# Patient Record
Sex: Female | Born: 1965 | Race: Black or African American | Hispanic: No | Smoking: Never smoker
Health system: Southern US, Community
[De-identification: ages and names within clinical notes are randomized; demographics above are authoritative.]

## PROBLEM LIST (undated history)

## (undated) DIAGNOSIS — I10 Essential (primary) hypertension: Secondary | ICD-10-CM

## (undated) DIAGNOSIS — Z87442 Personal history of urinary calculi: Secondary | ICD-10-CM

## (undated) HISTORY — PX: THYROIDECTOMY: SHX17

## (undated) HISTORY — PX: CHOLECYSTECTOMY: SHX55

## (undated) HISTORY — PX: TUBAL LIGATION: SHX77

---

## 2004-08-29 ENCOUNTER — Emergency Department: Payer: Self-pay | Admitting: Internal Medicine

## 2004-12-08 ENCOUNTER — Emergency Department: Payer: Self-pay | Admitting: Emergency Medicine

## 2005-12-14 ENCOUNTER — Emergency Department: Payer: Self-pay | Admitting: Internal Medicine

## 2006-02-27 ENCOUNTER — Emergency Department: Payer: Self-pay | Admitting: Emergency Medicine

## 2006-07-17 ENCOUNTER — Other Ambulatory Visit: Payer: Self-pay

## 2006-07-17 ENCOUNTER — Emergency Department: Payer: Self-pay | Admitting: General Practice

## 2006-10-27 ENCOUNTER — Emergency Department: Payer: Self-pay | Admitting: Emergency Medicine

## 2006-12-16 ENCOUNTER — Emergency Department: Payer: Self-pay | Admitting: Emergency Medicine

## 2007-01-15 ENCOUNTER — Emergency Department: Payer: Self-pay | Admitting: Emergency Medicine

## 2007-04-12 ENCOUNTER — Ambulatory Visit: Payer: Self-pay | Admitting: Family Medicine

## 2007-06-13 ENCOUNTER — Emergency Department: Payer: Self-pay | Admitting: Internal Medicine

## 2007-10-13 ENCOUNTER — Emergency Department: Payer: Self-pay | Admitting: Emergency Medicine

## 2008-02-13 ENCOUNTER — Emergency Department: Payer: Self-pay | Admitting: Internal Medicine

## 2008-09-24 ENCOUNTER — Emergency Department: Payer: Self-pay | Admitting: Emergency Medicine

## 2009-01-29 ENCOUNTER — Emergency Department: Payer: Self-pay | Admitting: Emergency Medicine

## 2009-03-04 ENCOUNTER — Emergency Department: Payer: Self-pay | Admitting: Unknown Physician Specialty

## 2010-05-28 ENCOUNTER — Emergency Department: Payer: Self-pay | Admitting: Emergency Medicine

## 2010-06-07 ENCOUNTER — Emergency Department: Payer: Self-pay | Admitting: Emergency Medicine

## 2010-08-13 ENCOUNTER — Emergency Department: Payer: Self-pay | Admitting: Emergency Medicine

## 2010-08-18 ENCOUNTER — Emergency Department: Payer: Self-pay | Admitting: Emergency Medicine

## 2010-11-22 ENCOUNTER — Emergency Department: Payer: Self-pay | Admitting: Emergency Medicine

## 2011-03-14 ENCOUNTER — Emergency Department: Payer: Self-pay | Admitting: Emergency Medicine

## 2011-07-14 ENCOUNTER — Emergency Department: Payer: Self-pay | Admitting: Emergency Medicine

## 2012-05-24 ENCOUNTER — Emergency Department: Payer: Self-pay | Admitting: Emergency Medicine

## 2013-08-24 ENCOUNTER — Ambulatory Visit: Payer: Self-pay | Admitting: Otolaryngology

## 2013-09-11 ENCOUNTER — Ambulatory Visit: Payer: Self-pay | Admitting: Otolaryngology

## 2013-09-11 DIAGNOSIS — I10 Essential (primary) hypertension: Secondary | ICD-10-CM

## 2013-09-11 LAB — POTASSIUM: POTASSIUM: 3.3 mmol/L — AB (ref 3.5–5.1)

## 2013-09-20 ENCOUNTER — Ambulatory Visit: Payer: Self-pay | Admitting: Otolaryngology

## 2013-09-20 LAB — CALCIUM: CALCIUM: 9.1 mg/dL (ref 8.5–10.1)

## 2013-09-20 LAB — POTASSIUM: Potassium: 3.7 mmol/L (ref 3.5–5.1)

## 2013-09-21 LAB — CALCIUM: Calcium, Total: 8.6 mg/dL (ref 8.5–10.1)

## 2013-09-22 LAB — CALCIUM: CALCIUM: 9.2 mg/dL (ref 8.5–10.1)

## 2013-09-22 LAB — PATHOLOGY REPORT

## 2014-01-06 ENCOUNTER — Emergency Department: Payer: Self-pay | Admitting: Internal Medicine

## 2014-01-06 LAB — COMPREHENSIVE METABOLIC PANEL
AST: 11 U/L — AB (ref 15–37)
Albumin: 3.7 g/dL (ref 3.4–5.0)
Alkaline Phosphatase: 74 U/L
Anion Gap: 5 — ABNORMAL LOW (ref 7–16)
BILIRUBIN TOTAL: 0.5 mg/dL (ref 0.2–1.0)
BUN: 13 mg/dL (ref 7–18)
CREATININE: 0.9 mg/dL (ref 0.60–1.30)
Calcium, Total: 9 mg/dL (ref 8.5–10.1)
Chloride: 101 mmol/L (ref 98–107)
Co2: 32 mmol/L (ref 21–32)
EGFR (African American): >60
EGFR (Non-African Amer.): >60
Glucose: 101 mg/dL — ABNORMAL HIGH (ref 65–99)
OSMOLALITY: 276 (ref 275–301)
POTASSIUM: 3.4 mmol/L — AB (ref 3.5–5.1)
SGPT (ALT): 20 U/L (ref 12–78)
Sodium: 138 mmol/L (ref 136–145)
TOTAL PROTEIN: 8.1 g/dL (ref 6.4–8.2)

## 2014-01-06 LAB — CBC
HCT: 36.1 % (ref 35.0–47.0)
HGB: 12 g/dL (ref 12.0–16.0)
MCH: 27.1 pg (ref 26.0–34.0)
MCHC: 33.3 g/dL (ref 32.0–36.0)
MCV: 81 fL (ref 80–100)
Platelet: 225 10*3/uL (ref 150–440)
RBC: 4.44 10*6/uL (ref 3.80–5.20)
RDW: 14.2 % (ref 11.5–14.5)
WBC: 5.9 10*3/uL (ref 3.6–11.0)

## 2014-01-06 LAB — T4, FREE: Free Thyroxine: 1.13 ng/dL (ref 0.76–1.46)

## 2014-01-06 LAB — TSH: Thyroid Stimulating Horm: 28.3 u[IU]/mL — ABNORMAL HIGH

## 2014-01-06 LAB — PRO B NATRIURETIC PEPTIDE: B-Type Natriuretic Peptide: 26 pg/mL (ref 0–125)

## 2014-01-06 LAB — TROPONIN I: Troponin-I: <0.02 ng/mL

## 2014-01-19 ENCOUNTER — Emergency Department: Payer: Self-pay | Admitting: Emergency Medicine

## 2014-01-20 LAB — URINALYSIS, COMPLETE
Bacteria: NONE SEEN
Bilirubin,UR: NEGATIVE
Glucose,UR: NEGATIVE mg/dL (ref 0–75)
Ketone: NEGATIVE
Leukocyte Esterase: NEGATIVE
Nitrite: NEGATIVE
PROTEIN: NEGATIVE
Ph: 6 (ref 4.5–8.0)
RBC,UR: 131 /HPF (ref 0–5)
SPECIFIC GRAVITY: 1.019 (ref 1.003–1.030)
Squamous Epithelial: <1
WBC UR: 1 /HPF (ref 0–5)

## 2014-06-09 ENCOUNTER — Emergency Department: Payer: Self-pay | Admitting: Emergency Medicine

## 2014-06-11 ENCOUNTER — Emergency Department: Payer: Self-pay | Admitting: Emergency Medicine

## 2014-11-03 NOTE — Discharge Summary (Signed)
PATIENT NAME:  Kayla Christensen, Kayla Christensen MR#:  [id number] DATE OF BIRTH:  August 20, 1965  DATE OF ADMISSION:  09/20/2013 DATE OF DISCHARGE:  09/22/2013  REASON FOR ADMISSION: Thyroid goiter.  OPERATIONS/MAJOR PROCEDURES: Total thyroidectomy performed on 09/20/2013.   HISTORY OF PRESENT ILLNESS: Christensen 49 year old female presented with Christensen massive thyroid goiter causing compression of the throat. She was taken to the operating room on 09/20/2013 and total thyroidectomy was performed. Postoperatively, she was observed on the fluoro monitoring for stability of her calcium level. Drains were also in place. Calcium level did not drop in the postoperative period. Postoperative day #1, drains were removed. Calcium level remained stable, but she is still having Christensen bit of discomfort and slow to return to normal diet. On  postoperative day 2, the patient was eating Christensen normal diet with reasonable pain control. She was discharged home on Lortab for pain control and was to resume her calcium supplement. She is to follow up in Christensen week for suture removal. The calcium level on postoperative day #2 remained normal.   ____________________________ Ollen GrossPaul S. Willeen CassBennett, MD psb:aw D: 10/10/2013 11:58:46 ET T: 10/10/2013 12:13:09 ET JOB#: [id number]  cc: Ollen GrossPaul S. Willeen CassBennett, MD, <Dictator> Sandi MealyPAUL S Cenia Zaragosa MD ELECTRONICALLY SIGNED 10/17/2013 10:58

## 2014-11-03 NOTE — Op Note (Signed)
PATIENT NAME:  Kayla Christensen, Kayla Christensen MR#:  [id number] DATE OF BIRTH:  1966/03/28  DATE OF PROCEDURE:  09/20/2013  PREOPERATIVE DIAGNOSIS: Thyroid goiter.   POSTOPERATIVE DIAGNOSIS: Thyroid goiter.   PROCEDURE: Total thyroidectomy.   SURGEON: Marion Downer, MD  FIRST ASSISTANT: Cammy Copa, MD  ANESTHESIA: General endotracheal.   INDICATIONS: A 49 year old with a massively enlarged thyroid goiter which is causing some restriction of the airway for the patient in certain positions.   FINDINGS: This was a massive thyroid goiter with each lobe measuring over 9 cm in greatest dimension, with multiple nodules in both lobes.   COMPLICATIONS: None.   DESCRIPTION OF PROCEDURE: After obtaining informed consent, the patient was taken to the operating room and placed in the supine position. General endotracheal anesthesia was administered and the tube placed under direct visualization so that the laryngeal monitor electrode on the tube was properly positioned between the vocal cords. The skin was then injected in the lower neck with 1% lidocaine with epinephrine 1:100,000. She was then prepped and draped in the usual sterile fashion. A 15-blade was used to incise the skin and the incision carried down through the platysma with the Bovie. The strap muscles were identified and divided in the midline and retracted laterally. The right thyroid lobe was addressed first. Blunt dissection was performed on the capsule of the gland, dissecting fascial attachments away from the thyroid lobe. It was very difficult to get into the lateral compartment of the neck to the lateral aspect of the gland, so the strap muscle on this side was divided with the Bovie. This allowed better visualization for dissection around the gland. The superior pole vessels were then carefully dissected out. As vessels were encountered, they were divided using the Harmonic scalpel. The dissection proceeded around the lateral aspect of the  lobe, dividing small vascular attachments, and down to the inferior lobe, where the inferior pole vessels were identified and divided at the capsule of the gland. Inferiorly, a parathyroid gland was identified and carefully preserved along with its vascular pedicle. Dissection was difficult due to the size of the gland, but eventually, with a combination of blunt dissection and dividing soft tissue attachments with the Harmonic scalpel, the gland was freed up enough to be rotated out of the lateral neck. This provided visualization along the tracheoesophageal groove. Careful dissection in this area revealed the recurrent laryngeal nerve, which was dissected up to its entrance into the larynx. With this path well-visualized, the gland was then carefully dissected off of the trachea using the Harmonic scalpel. The gland was divided at the isthmus and delivered and sent as a specimen. Next, the left lobe of the thyroid gland was dissected in the same fashion, once again using blunt dissection around the capsule of the gland to free it up from the overlying strap muscles. On this side, we were able to dissect the gland, with the neck having been somewhat decompressed after removal of the right lobe, without dividing the strap muscles on the left-hand side. The gland was carefully dissected out, and both superior and inferior pole vessels divided as they were encountered using the Harmonic scalpel. Parathyroid glands were identified, both superiorly and inferiorly on the left side. The gland was rotated medially and dissected away from the trachea after identifying the recurrent laryngeal nerve and protecting it during the dissection. The gland was then completely dissected off the trachea and delivered and sent as a separate specimen. Both recurrent laryngeal nerves were noted to  stimulate appropriately at the end of the case. Three of the four parathyroid glands were identified, and there was no evidence of any  parathyroid tissue on the specimen itself. The wound was irrigated and bleeding controlled with the bipolar cautery. The #10 TLS drains were placed in both sides of the neck and secured with 3-0 Prolene suture. The strap muscle on the right side was reapproximated with 4-0 Vicryl suture. The platysma muscle was then reapproximated with 4-0 Vicryl, followed by subcutaneous closure with 4-0 Vicryl suture. The skin was closed with a 3-0 Prolene suture in a running subcuticular stitch. The drains were then hooked up to suction. Bacitracin ointment was applied, and the patient returned to the anesthesiologist for awakening. She was awakened and taken to the recovery room in good condition postoperatively. Blood loss was approximately 50 mL.   ____________________________ Spruha Weight S. Willeen CassBennett, MD psb:lb D: 03/11/2015Ollen Gross 13:35:03 ET T: 09/20/2013 13:50:11 ET JOB#: [id number]  cc: Ollen GrossPaul S. Willeen CassBennett, MD, <Dictator> Sandi MealyPAUL S Austynn Pridmore MD ELECTRONICALLY SIGNED 09/20/2013 16:11

## 2015-04-21 ENCOUNTER — Emergency Department: Payer: Commercial Managed Care - HMO

## 2015-04-21 ENCOUNTER — Emergency Department
Admission: EM | Admit: 2015-04-21 | Discharge: 2015-04-21 | Disposition: A | Discharge status: Home / Self Care | Payer: Commercial Managed Care - HMO | Attending: Emergency Medicine | Admitting: Emergency Medicine

## 2015-04-21 ENCOUNTER — Encounter: Payer: Self-pay | Admitting: Emergency Medicine

## 2015-04-21 DIAGNOSIS — I517 Cardiomegaly: Secondary | ICD-10-CM

## 2015-04-21 DIAGNOSIS — Z880 Allergy status to penicillin: Secondary | ICD-10-CM | POA: Diagnosis not present

## 2015-04-21 DIAGNOSIS — I10 Essential (primary) hypertension: Secondary | ICD-10-CM

## 2015-04-21 DIAGNOSIS — R109 Unspecified abdominal pain: Secondary | ICD-10-CM | POA: Insufficient documentation

## 2015-04-21 DIAGNOSIS — R079 Chest pain, unspecified: Secondary | ICD-10-CM

## 2015-04-21 DIAGNOSIS — R0789 Other chest pain: Secondary | ICD-10-CM | POA: Diagnosis present

## 2015-04-21 HISTORY — DX: Essential (primary) hypertension: I10

## 2015-04-21 LAB — CBC WITH DIFFERENTIAL/PLATELET
Basophils Absolute: 0.1 10*3/uL (ref 0–0.1)
Basophils Relative: 1 %
EOS ABS: 0.1 10*3/uL (ref 0–0.7)
EOS PCT: 2 %
HCT: 35.8 % (ref 35.0–47.0)
Hemoglobin: 11.6 g/dL — ABNORMAL LOW (ref 12.0–16.0)
LYMPHS ABS: 2.2 10*3/uL (ref 1.0–3.6)
Lymphocytes Relative: 39 %
MCH: 26.2 pg (ref 26.0–34.0)
MCHC: 32.5 g/dL (ref 32.0–36.0)
MCV: 80.8 fL (ref 80.0–100.0)
MONOS PCT: 9 %
Monocytes Absolute: 0.5 10*3/uL (ref 0.2–0.9)
Neutro Abs: 2.8 10*3/uL (ref 1.4–6.5)
Neutrophils Relative %: 49 %
PLATELETS: 214 10*3/uL (ref 150–440)
RBC: 4.44 MIL/uL (ref 3.80–5.20)
RDW: 14.7 % — ABNORMAL HIGH (ref 11.5–14.5)
WBC: 5.7 10*3/uL (ref 3.6–11.0)

## 2015-04-21 LAB — URINALYSIS COMPLETE WITH MICROSCOPIC (ARMC ONLY)
Bilirubin Urine: NEGATIVE
Glucose, UA: NEGATIVE mg/dL
KETONES UR: NEGATIVE mg/dL
Leukocytes, UA: NEGATIVE
NITRITE: NEGATIVE
PH: 6 (ref 5.0–8.0)
PROTEIN: 30 mg/dL — AB
SPECIFIC GRAVITY, URINE: 1.01 (ref 1.005–1.030)

## 2015-04-21 LAB — COMPREHENSIVE METABOLIC PANEL
ALT: 16 U/L (ref 14–54)
ANION GAP: 5 (ref 5–15)
AST: 24 U/L (ref 15–41)
Albumin: 4.2 g/dL (ref 3.5–5.0)
Alkaline Phosphatase: 54 U/L (ref 38–126)
BUN: 14 mg/dL (ref 6–20)
CHLORIDE: 107 mmol/L (ref 101–111)
CO2: 28 mmol/L (ref 22–32)
Calcium: 8.8 mg/dL — ABNORMAL LOW (ref 8.9–10.3)
Creatinine, Ser: 0.86 mg/dL (ref 0.44–1.00)
GFR calc non Af Amer: >60 mL/min (ref >60)
Glucose, Bld: 117 mg/dL — ABNORMAL HIGH (ref 65–99)
POTASSIUM: 2.9 mmol/L — AB (ref 3.5–5.1)
SODIUM: 140 mmol/L (ref 135–145)
Total Bilirubin: 0.7 mg/dL (ref 0.3–1.2)
Total Protein: 7.7 g/dL (ref 6.5–8.1)

## 2015-04-21 LAB — TROPONIN I: Troponin I: <0.03 ng/mL (ref <0.031)

## 2015-04-21 MED ORDER — KETOROLAC TROMETHAMINE 10 MG PO TABS: 10.0000 mg | ORAL_TABLET | Freq: Three times a day (TID) | ORAL | Status: DC | PRN

## 2015-04-21 MED ORDER — POTASSIUM CHLORIDE CRYS ER 20 MEQ PO TBCR
40.0000 meq | EXTENDED_RELEASE_TABLET | Freq: Once | ORAL | Status: AC
  Administered 2015-04-21: 40 meq via ORAL
  Filled 2015-04-21: qty 2

## 2015-04-21 MED ORDER — KETOROLAC TROMETHAMINE 30 MG/ML IJ SOLN
30.0000 mg | Freq: Once | INTRAMUSCULAR | Status: AC
  Administered 2015-04-21: 30 mg via INTRAVENOUS
  Filled 2015-04-21: qty 1

## 2015-04-21 NOTE — ED Notes (Signed)
Pt states left sided flank pain that "ends at that last rib". Pt denies nausea, vomiting, dizziness, shortness of breath. Pt pointing to lateral left chest wall.

## 2015-04-21 NOTE — ED Provider Notes (Signed)
[facility] Emergency Department Provider Note  ____________________________________________  Time seen: Approximately 9:11 PM  I have reviewed the triage vital signs and the nursing notes.   HISTORY  Chief Complaint Flank Pain    HPI Kayla Christensen is a 49 y.o. female with a history of HTN, status post thyroidectomy, presenting with left lateral chest wall pain. Patient reports that several days ago she developed an "aggravating," left lateral chest wall pain. It is worse with positional changes. It is not associated with shortness of breath, palpitations, radiation, nausea or vomiting, fever, cough or reported skin changes. Pain is not pleuritic. No recent known strain or new exercises.. She has tried a heating pad which has helped. She has not tried any medications for her pain.   Past Medical History  Diagnosis Date  . Hypertension     There are no active problems to display for this patient.   Past Surgical History  Procedure Laterality Date  . Thyroidectomy      Current Outpatient Rx  Name  Route  Sig  Dispense  Refill  . ketorolac (TORADOL) 10 MG tablet   Oral   Take 1 tablet (10 mg total) by mouth every 8 (eight) hours as needed for moderate pain (with food).   15 tablet   0     Allergies Penicillins  History reviewed. No pertinent family history.  Social History Social History  Substance Use Topics  . Smoking status: Never Smoker   . Smokeless tobacco: Never Used  . Alcohol Use: No    Review of Systems Constitutional: No fever/chills. No syncope. No lightheadedness. Eyes: No visual changes. ENT: No sore throat. Cardiovascular: Positive chest pain, no palpitations. Respiratory: Denies shortness of breath.  No cough. Gastrointestinal: No abdominal pain.  No nausea, no vomiting.  No diarrhea.  No constipation. Genitourinary: Negative for dysuria. No hematuria. No change in frequency. Musculoskeletal: Negative for back  pain. Skin: Negative for rash. Neurological: Negative for headaches, focal weakness or numbness.  10-point ROS otherwise negative.  ____________________________________________   PHYSICAL EXAM:  VITAL SIGNS: ED Triage Vitals  Enc Vitals Group     BP 04/21/15 2044 193/104 mmHg     Pulse Rate 04/21/15 2044 65     Resp 04/21/15 2044 16     Temp 04/21/15 2044 98.4 F (36.9 C)     Temp Source 04/21/15 2044 Oral     SpO2 04/21/15 2044 99 %     Weight 04/21/15 2044 260 lb (117.935 kg)     Height 04/21/15 2044  (1.753 m)     Head Cir --      Peak Flow --      Pain Score 04/21/15 2045 7     Pain Loc --      Pain Edu? --      Excl. in GC? --     Constitutional: Alert and oriented. Well appearing and in no acute distress. Answer question appropriately. Eyes: Conjunctivae are normal.  EOMI. Head: Atraumatic. Nose: No congestion/rhinnorhea. Mouth/Throat: Mucous membranes are moist.  Neck: No stridor.  Supple.  Cardiovascular: Normal rate, regular rhythm. No murmurs, rubs or gallops.  Respiratory: Normal respiratory effort.  No retractions. Lungs CTAB.  No wheezes, rales or ronchi. No reproducible tenderness to the palpation on the left lateral chest wall. The skin is normal. There is no crepitus. There is no underlying instability of the ribs. Gastrointestinal: Soft and nontender. No distention. No peritoneal signs. Musculoskeletal: No LE edema. No  palpable cords or calf pain. No Homans sign. Neurologic:  Normal speech and language. No gross focal neurologic deficits are appreciated.  Skin:  Skin is warm, dry and intact. No rash noted. Psychiatric: Mood and affect are normal. Speech and behavior are normal.  Normal judgement.  ____________________________________________   LABS (all labs ordered are listed, but only abnormal results are displayed)  Labs Reviewed  CBC WITH DIFFERENTIAL/PLATELET - Abnormal; Notable for the following:    Hemoglobin 11.6 (*)    RDW 14.7 (*)     All other components within normal limits  COMPREHENSIVE METABOLIC PANEL - Abnormal; Notable for the following:    Potassium 2.9 (*)    Glucose, Bld 117 (*)    Calcium 8.8 (*)    All other components within normal limits  URINALYSIS COMPLETEWITH MICROSCOPIC ([facility] ONLY) - Abnormal; Notable for the following:    Color, Urine STRAW (*)    APPearance CLEAR (*)    Hgb urine dipstick 1+ (*)    Protein, ur 30 (*)    Bacteria, UA RARE (*)    Squamous Epithelial / LPF 0-5 (*)    All other components within normal limits  TROPONIN I   ____________________________________________  EKG  ED ECG REPORT I, Rockne Menghini, the attending physician, personally viewed and interpreted this ECG.   Date: 04/21/2015  EKG Time: 2051  Rate: 70  Rhythm: normal EKG, normal sinus rhythm, unchanged from previous tracings, normal sinus rhythm  Axis: Leftward  Intervals:none  ST&T Change: Nonspecific less than 0.5 mm elevation in V1 with reciprocal changes.  ____________________________________________  RADIOLOGY  Dg Chest 2 View  04/21/2015   CLINICAL DATA:  Initial evaluation for acute left-sided chest pain for 4 days.  EXAM: CHEST  2 VIEW  COMPARISON:  Prior radiograph from 01/06/2014.  FINDINGS: Mild cardiomegaly is stable. Mediastinal silhouette within normal limits. Mild tortuosity the intrathoracic aorta.  The lungs are normally inflated. No airspace consolidation, pleural effusion, or pulmonary edema is identified. There is no pneumothorax.  No acute osseous abnormality identified.  IMPRESSION: 1. No active cardiopulmonary disease. 2. Mild cardiomegaly, stable.   Electronically Signed   By: Rise Mu M.D.   On: 04/21/2015 21:49    ____________________________________________   PROCEDURES  Procedure(s) performed: None  Critical Care performed: No ____________________________________________   INITIAL IMPRESSION / ASSESSMENT AND PLAN / ED COURSE  Pertinent labs &  imaging results that were available during my care of the patient were reviewed by me and considered in my medical decision making (see chart for details).  49 y.o. female with a history of hypertension presenting for left lateral chest wall pain which is positional. Will do cardiac evaluation and given the length of symptoms, will do a single set of troponin. We'll get a chest x-ray to rule out pneumothorax, she has good breath sounds. Will treat symptoms with Toradol and reevaluate.  ----------------------------------------- 10:06 PM on 04/21/2015 -----------------------------------------  Pt is feeling better after Toradol.  Patient EKG does not show any ischemic changes. Her troponin is negative. Her symptoms are atypical and this would be very unlikely to represent an MI. Her chest x-ray shows stable cardiomegaly. I will relay this to the patient and she will need to have it followed up by her primary care physician; probably discuss echocardiogram as an outpatient. The rest of her labs are reassuring except for hypokalemia which I will supplement and she will also follow up with a primary care physician for this. Plan discharge.  ____________________________________________  FINAL  CLINICAL IMPRESSION(S) / ED DIAGNOSES  Final diagnoses:  Left sided chest pain  Essential hypertension  Enlarged heart      NEW MEDICATIONS STARTED DURING THIS VISIT:  New Prescriptions   KETOROLAC (TORADOL) 10 MG TABLET    Take 1 tablet (10 mg total) by mouth every 8 (eight) hours as needed for moderate pain (with food).     Rockne Menghini, MD 04/21/15 2218

## 2015-04-21 NOTE — Discharge Instructions (Signed)
Chest Pain Observation It is often hard to give a specific diagnosis for the cause of chest pain. Among other possibilities your symptoms might be caused by inadequate oxygen delivery to your heart (angina). Angina that is not treated or evaluated can lead to a heart attack (myocardial infarction) or death. Blood tests, electrocardiograms, and X-rays may have been done to help determine a possible cause of your chest pain. After evaluation and observation, your health care provider has determined that it is unlikely your pain was caused by an unstable condition that requires hospitalization. However, a full evaluation of your pain may need to be completed, with additional diagnostic testing as directed. It is very important to keep your follow-up appointments. Not keeping your follow-up appointments could result in permanent heart damage, disability, or death. If there is any problem keeping your follow-up appointments, you must call your health care provider. HOME CARE INSTRUCTIONS  Due to the slight chance that your pain could be angina, it is important to follow your health care provider's treatment plan and also maintain a healthy lifestyle:  Maintain or work toward achieving a healthy weight.  Stay physically active and exercise regularly.  Decrease your salt intake.  Eat a balanced, healthy diet. Talk to a dietitian to learn about heart-healthy foods.  Increase your fiber intake by including whole grains, vegetables, fruits, and nuts in your diet.  Avoid situations that cause stress, anger, or depression.  Take medicines as advised by your health care provider. Report any side effects to your health care provider. Do not stop medicines or adjust the dosages on your own.  Quit smoking. Do not use nicotine patches or gum until you check with your health care provider.  Keep your blood pressure, blood sugar, and cholesterol levels within normal limits.  Limit alcohol intake to no more than  1 drink per day for women who are not pregnant and 2 drinks per day for men.  Do not abuse drugs. SEEK IMMEDIATE MEDICAL CARE IF: You have severe chest pain or pressure which may include symptoms such as:  You feel pain or pressure in your arms, neck, jaw, or back.  You have severe back or abdominal pain, feel sick to your stomach (nauseous), or throw up (vomit).  You are sweating profusely.  You are having a fast or irregular heartbeat.  You feel short of breath while at rest.  You notice increasing shortness of breath during rest, sleep, or with activity.  You have chest pain that does not get better after rest or after taking your usual medicine.  You wake from sleep with chest pain.  You are unable to sleep because you cannot breathe.  You develop a frequent cough or you are coughing up blood.  You feel dizzy, faint, or experience extreme fatigue.  You develop severe weakness, dizziness, fainting, or chills. Any of these symptoms may represent a serious problem that is an emergency. Do not wait to see if the symptoms will go away. Call your local emergency services (911 in the [place] Do not drive yourself to the hospital. MAKE SURE YOU:  Understand these instructions.  Will watch your condition.  Will get help right away if you are not doing well or get worse.    Please make an appointment with your primary care physician to discuss your chest pain and to have your blood pressure reevaluated. Continue to take your blood pressure medication as prescribed. You may take Toradol for the chest wall pain. You may also  apply a heat pack or an ice pack to the side of your chest for 20 minutes every 2-3 hours for pain control.  Please also talked to your physician about your enlarged heart. Your physician will order an outpatient echocardiogram.  Please return to the emergency department if he develops severe pain, passing out, shortness of breath, nausea vomiting or sweatiness,  or any other symptoms concerning to you.   This information is not intended to replace advice given to you by your health care provider. Make sure you discuss any questions you have with your health care provider.   Document Released: 08/01/2010 Document Revised: 07/04/2013 Document Reviewed: 12/29/2012 Elsevier Interactive Patient Education [employer]

## 2015-07-09 ENCOUNTER — Emergency Department: Payer: Commercial Managed Care - HMO

## 2015-07-09 ENCOUNTER — Emergency Department
Admission: EM | Admit: 2015-07-09 | Discharge: 2015-07-09 | Disposition: A | Discharge status: Home / Self Care | Payer: Commercial Managed Care - HMO | Attending: Emergency Medicine | Admitting: Emergency Medicine

## 2015-07-09 ENCOUNTER — Encounter: Payer: Self-pay | Admitting: Emergency Medicine

## 2015-07-09 DIAGNOSIS — Y998 Other external cause status: Secondary | ICD-10-CM | POA: Insufficient documentation

## 2015-07-09 DIAGNOSIS — Z880 Allergy status to penicillin: Secondary | ICD-10-CM | POA: Insufficient documentation

## 2015-07-09 DIAGNOSIS — M19011 Primary osteoarthritis, right shoulder: Secondary | ICD-10-CM | POA: Diagnosis not present

## 2015-07-09 DIAGNOSIS — Y9389 Activity, other specified: Secondary | ICD-10-CM | POA: Diagnosis not present

## 2015-07-09 DIAGNOSIS — Y9289 Other specified places as the place of occurrence of the external cause: Secondary | ICD-10-CM | POA: Diagnosis not present

## 2015-07-09 DIAGNOSIS — I10 Essential (primary) hypertension: Secondary | ICD-10-CM | POA: Insufficient documentation

## 2015-07-09 DIAGNOSIS — W208XXA Other cause of strike by thrown, projected or falling object, initial encounter: Secondary | ICD-10-CM | POA: Insufficient documentation

## 2015-07-09 DIAGNOSIS — S4991XA Unspecified injury of right shoulder and upper arm, initial encounter: Secondary | ICD-10-CM | POA: Diagnosis present

## 2015-07-09 MED ORDER — MELOXICAM 7.5 MG PO TABS: 7.5000 mg | ORAL_TABLET | Freq: Every day | ORAL | Status: DC

## 2015-07-09 MED ORDER — TRAMADOL HCL 50 MG PO TABS: 50.0000 mg | ORAL_TABLET | Freq: Four times a day (QID) | ORAL | Status: DC | PRN

## 2015-07-09 NOTE — ED Provider Notes (Signed)
[facility] Emergency Department Provider Note  ____________________________________________  Time seen: Approximately 12:17 PM  I have reviewed the triage vital signs and the nursing notes.   HISTORY  Chief Complaint Shoulder Pain    HPI Michail JewelsShirley A Nixon Jones is a 49 y.o. female patient complaining of right shoulder pain for approximately 2 weeks. Patient states the onset of pain was when a box fell on her shoulder. Patient states she is having increasing pain with abduction and overhead reaching of the right upper extremity. No other provocative incidents for her complaint. Patient is rating her pain as a 9/10. No palliative measures taken for this complaint.  Past Medical History  Diagnosis Date  . Hypertension     There are no active problems to display for this patient.   Past Surgical History  Procedure Laterality Date  . Thyroidectomy      Current Outpatient Rx  Name  Route  Sig  Dispense  Refill  . ketorolac (TORADOL) 10 MG tablet   Oral   Take 1 tablet (10 mg total) by mouth every 8 (eight) hours as needed for moderate pain (with food).   15 tablet   0   . meloxicam (MOBIC) 7.5 MG tablet   Oral   Take 1 tablet (7.5 mg total) by mouth daily.   30 tablet   0   . traMADol (ULTRAM) 50 MG tablet   Oral   Take 1 tablet (50 mg total) by mouth every 6 (six) hours as needed.   20 tablet   0     Allergies Penicillins  No family history on file.  Social History Social History  Substance Use Topics  . Smoking status: Never Smoker   . Smokeless tobacco: Never Used  . Alcohol Use: No    Review of Systems Constitutional: No fever/chills Eyes: No visual changes. ENT: No sore throat. Cardiovascular: Denies chest pain. Respiratory: Denies shortness of breath. Gastrointestinal: No abdominal pain.  No nausea, no vomiting.  No diarrhea.  No constipation. Genitourinary: Negative for dysuria. Musculoskeletal: Shoulder pain  Skin:  Negative for rash. Neurological: Negative for headaches, focal weakness or numbness. Endocrine:Hypertension Hematological/Lymphatic: Allergic/Immunilogical: Penicillin  10-point ROS otherwise negative.  ____________________________________________   PHYSICAL EXAM:  VITAL SIGNS: ED Triage Vitals  Enc Vitals Group     BP 07/09/15 1138 166/91 mmHg     Pulse Rate 07/09/15 1138 67     Resp 07/09/15 1138 18     Temp 07/09/15 1138 98.2 F (36.8 C)     Temp Source 07/09/15 1138 Oral     SpO2 07/09/15 1138 98 %     Weight 07/09/15 1138 252 lb (114.306 kg)     Height 07/09/15 1138 5\' 9"  (1.753 m)     Head Cir --      Peak Flow --      Pain Score 07/09/15 1140 9     Pain Loc --      Pain Edu? --      Excl. in GC? --     Constitutional: Alert and oriented. Well appearing and in no acute distress. Eyes: Conjunctivae are normal. PERRL. EOMI. Head: Atraumatic. Nose: No congestion/rhinnorhea. Mouth/Throat: Mucous membranes are moist.  Oropharynx non-erythematous. Neck: No stridor. No cervical spine tenderness to palpation. Hematological/Lymphatic/Immunilogical: No cervical lymphadenopathy. Cardiovascular: Normal rate, regular rhythm. Grossly normal heart sounds.  Good peripheral circulation. Respiratory: Normal respiratory effort.  No retractions. Lungs CTAB. Gastrointestinal: Soft and nontender. No distention. No abdominal bruits. No CVA tenderness.  Musculoskeletal: No lower extremity tenderness nor edema.  No joint effusions. Neurologic:  Normal speech and language. No gross focal neurologic deficits are appreciated. No gait instability. Skin:  Skin is warm, dry and intact. No rash noted. Psychiatric: Mood and affect are normal. Speech and behavior are normal.  ____________________________________________   LABS (all labs ordered are listed, but only abnormal results are displayed)  Labs Reviewed - No data to  display ____________________________________________  EKG   ____________________________________________  RADIOLOGY  No acute findings of x-rays of right shoulder. Degenerative changes in the [facility] joint ____________________________________________   PROCEDURES  Procedure(s) performed: None  Critical Care performed: No  ____________________________________________   INITIAL IMPRESSION / ASSESSMENT AND PLAN / ED COURSE  Pertinent labs & imaging results that were available during my care of the patient were reviewed by me and considered in my medical decision making (see chart for details).  Right shoulder pain secondary to arthritis. Information given discharge Instructions. Patient given prescription for Motrin and tramadol. Patient advised to follow up with family doctor for continued care. ____________________________________________   FINAL CLINICAL IMPRESSION(S) / ED DIAGNOSES  Final diagnoses:  Arthritis of right shoulder region      Joni Reining, PA-C 07/09/15 1301  Emily Filbert, MD 07/09/15 1351

## 2015-07-09 NOTE — ED Notes (Signed)
Pt reports that she has pain and difficulty moving her right shoulder. She remembers a bruise on it a week and a half ago and that a light box fell on it. She is unable to lift arm to shoulder height without pain. Pt alert & oriented with warm, dry skin.

## 2015-07-09 NOTE — ED Notes (Signed)
Pt discharged home after verbalizing understanding of discharge instructions; nad noted. 

## 2015-07-09 NOTE — ED Notes (Signed)
Right shoulder pain for about 1 week  Unsure of injury no deformity noted

## 2015-07-09 NOTE — Discharge Instructions (Signed)
  Arthritis Arthritis means joint pain. It can also mean joint disease. A joint is a place where bones come together. People who have arthritis may have:  Red joints.  Swollen joints.  Stiff joints.  Warm joints.  A fever.  A feeling of being sick. HOME CARE Pay attention to any changes in your symptoms. Take these actions to help with your pain and swelling. Medicines  Take over-the-counter and prescription medicines only as told by your doctor.  Do not take aspirin for pain if your doctor says that you may have gout. Activities  Rest your joint if your doctor tells you to.  Avoid activities that make the pain worse.  Exercise your joint regularly as told by your doctor. Try doing exercises like:  Swimming.  Water aerobics.  Biking.  Walking. Joint Care  If your joint is swollen, keep it raised (elevated) if told by your doctor.  If your joint feels stiff in the morning, try taking a warm shower.  If you have diabetes, do not apply heat without asking your doctor.  If told, apply heat to the joint:  Put a towel between the joint and the hot pack or heating pad.  Leave the heat on the area for 20-30 minutes.  If told, apply ice to the joint:  Put ice in a plastic bag.  Place a towel between your skin and the bag.  Leave the ice on for 20 minutes, 2-3 times per day.  Keep all follow-up visits as told by your doctor. GET HELP IF:  The pain gets worse.  You have a fever. GET HELP RIGHT AWAY IF:  You have very bad pain in your joint.  You have swelling in your joint.  Your joint is red.  Many joints become painful and swollen.  You have very bad back pain.  Your leg is very weak.  You cannot control your pee (urine) or poop (stool).   This information is not intended to replace advice given to you by your health care provider. Make sure you discuss any questions you have with your health care provider.   Document Released: 09/23/2009  Document Revised: 03/20/2015 Document Reviewed: 09/24/2014 Elsevier Interactive Patient Education 2016 [employer]  

## 2016-06-08 ENCOUNTER — Emergency Department
Admission: EM | Admit: 2016-06-08 | Discharge: 2016-06-08 | Disposition: A | Discharge status: Home / Self Care | Payer: Commercial Managed Care - HMO | Attending: Emergency Medicine | Admitting: Emergency Medicine

## 2016-06-08 DIAGNOSIS — M25572 Pain in left ankle and joints of left foot: Secondary | ICD-10-CM | POA: Diagnosis present

## 2016-06-08 DIAGNOSIS — M7751 Other enthesopathy of right foot: Secondary | ICD-10-CM

## 2016-06-08 DIAGNOSIS — M779 Enthesopathy, unspecified: Secondary | ICD-10-CM | POA: Diagnosis not present

## 2016-06-08 DIAGNOSIS — I10 Essential (primary) hypertension: Secondary | ICD-10-CM | POA: Diagnosis not present

## 2016-06-08 MED ORDER — MELOXICAM 7.5 MG PO TABS
15.0000 mg | ORAL_TABLET | Freq: Once | ORAL | Status: AC
  Administered 2016-06-08: 15 mg via ORAL

## 2016-06-08 MED ORDER — MELOXICAM 7.5 MG PO TABS
ORAL_TABLET | ORAL | Status: AC
  Filled 2016-06-08: qty 2

## 2016-06-08 MED ORDER — MELOXICAM 15 MG PO TABS: 15.0000 mg | ORAL_TABLET | Freq: Every day | ORAL | 0 refills | Status: DC

## 2016-06-08 NOTE — ED Notes (Signed)
Assessment done by PA; see PA notes.

## 2016-06-08 NOTE — ED Provider Notes (Signed)
[facility] Emergency Department Provider Note  ____________________________________________  Time seen: Approximately 8:07 PM  I have reviewed the triage vital signs and the nursing notes.   HISTORY  Chief Complaint No chief complaint on file.    HPI Kayla Christensen is a 50 y.o. female presents to emergency department complaining of pain to the left ankle. Patient states that she was on her feet for long period of time on Thursday cooking food for Thanksgiving. Patient states that afterwards she began to experience medial left ankle pain. Patient denies any direct trauma. She denies rolling her ankle. She denies anyswelling in any of the toes. The patient has not tried any medications for this complaint prior to arrival.She denies any previous history of injury, surgery to this ankle. No history of gout.   Past Medical History:  Diagnosis Date  . Hypertension     There are no active problems to display for this patient.   Past Surgical History:  Procedure Laterality Date  . THYROIDECTOMY      Prior to Admission medications   Medication Sig Start Date End Date Taking? Authorizing Provider  meloxicam (MOBIC) 15 MG tablet Take 1 tablet (15 mg total) by mouth daily. 06/08/16   Delorise RoyalsJonathan D Cuthriell, PA-C    Allergies Penicillins  No family history on file.  Social History Social History  Substance Use Topics  . Smoking status: Never Smoker  . Smokeless tobacco: Never Used  . Alcohol use No     Review of Systems  Constitutional: No fever/chills Cardiovascular: no chest pain. Respiratory: no cough. No SOB. Musculoskeletal: Positive for left ankle pain Skin: Negative for rash, abrasions, lacerations, ecchymosis. Neurological: Negative for headaches, focal weakness or numbness. 10-point ROS otherwise negative.  ____________________________________________   PHYSICAL EXAM:  VITAL SIGNS: ED Triage Vitals  Enc Vitals Group     BP       Pulse      Resp      Temp      Temp src      SpO2      Weight      Height      Head Circumference      Peak Flow      Pain Score      Pain Loc      Pain Edu?      Excl. in GC?      Constitutional: Alert and oriented. Well appearing and in no acute distress. Eyes: Conjunctivae are normal. PERRL. EOMI. Head: Atraumatic. Neck: No stridor.    Cardiovascular: Normal rate, regular rhythm. Normal S1 and S2.  Good peripheral circulation. Respiratory: Normal respiratory effort without tachypnea or retractions. Lungs CTAB. Good air entry to the bases with no decreased or absent breath sounds. Musculoskeletal: Full range of motion to all extremities. No gross deformities appreciated.Full range of motion to ankle. Inspection. Patient is moderately tender to palpation along the anterior talonavicular joint. No palpable abnormality. Dorsalis pedis pulse intact. Sensation intact 5 digits. Capillary refill intact 5 days. Neurologic:  Normal speech and language. No gross focal neurologic deficits are appreciated.  Skin:  Skin is warm, dry and intact. No rash noted. Psychiatric: Mood and affect are normal. Speech and behavior are normal. Patient exhibits appropriate insight and judgement.   ____________________________________________   LABS (all labs ordered are listed, but only abnormal results are displayed)  Labs Reviewed - No data to display ____________________________________________  EKG   ____________________________________________  RADIOLOGY   No results found.  ____________________________________________    PROCEDURES  Procedure(s) performed:    Procedures    Medications - No data to display   ____________________________________________   INITIAL IMPRESSION / ASSESSMENT AND PLAN / ED COURSE  Pertinent labs & imaging results that were available during my care of the patient were reviewed by me and considered in my medical decision making (see chart  for details).  Review of the [place] CSRS was performed in accordance of the [facility] prior to dispensing any controlled drugs.  Clinical Course     Patient's diagnosis is consistent with Tendinitis of the left ankle likely from standing for prolonged periods of time. No definitive injury. Exam is reassuring.  Patient is offered imaging and citric acid testing and patient declines at this time.. Patient will be discharged home with prescriptions for anti-inflammatories for symptomatic control. Patient is to follow up with podiatry as needed or otherwise directed. Patient is given ED precautions to return to the ED for any worsening or new symptoms.     ____________________________________________  FINAL CLINICAL IMPRESSION(S) / ED DIAGNOSES  Final diagnoses:  Tendonitis of ankle, right      NEW MEDICATIONS STARTED DURING THIS VISIT:  New Prescriptions   MELOXICAM (MOBIC) 15 MG TABLET    Take 1 tablet (15 mg total) by mouth daily.        This chart was dictated using voice recognition software/Dragon. Despite best efforts to proofread, errors can occur which can change the meaning. Any change was purely unintentional.    Racheal PatchesJonathan D Cuthriell, PA-C 06/08/16 2012    Jeanmarie PlantJames A McShane, MD 06/10/16 [phone]

## 2017-05-03 ENCOUNTER — Emergency Department
Admission: EM | Admit: 2017-05-03 | Discharge: 2017-05-03 | Disposition: A | Discharge status: Home / Self Care | Payer: PRIVATE HEALTH INSURANCE | Attending: Emergency Medicine | Admitting: Emergency Medicine

## 2017-05-03 ENCOUNTER — Emergency Department: Payer: PRIVATE HEALTH INSURANCE

## 2017-05-03 ENCOUNTER — Encounter: Payer: Self-pay | Admitting: Emergency Medicine

## 2017-05-03 DIAGNOSIS — I10 Essential (primary) hypertension: Secondary | ICD-10-CM | POA: Diagnosis not present

## 2017-05-03 DIAGNOSIS — M25562 Pain in left knee: Secondary | ICD-10-CM | POA: Insufficient documentation

## 2017-05-03 DIAGNOSIS — M25561 Pain in right knee: Secondary | ICD-10-CM | POA: Insufficient documentation

## 2017-05-03 DIAGNOSIS — G8929 Other chronic pain: Secondary | ICD-10-CM

## 2017-05-03 MED ORDER — TRAMADOL HCL 50 MG PO TABS
50.0000 mg | ORAL_TABLET | Freq: Once | ORAL | Status: AC
  Administered 2017-05-03: 50 mg via ORAL
  Filled 2017-05-03: qty 1

## 2017-05-03 MED ORDER — TRAMADOL HCL 50 MG PO TABS: 50.0000 mg | ORAL_TABLET | Freq: Four times a day (QID) | ORAL | 0 refills | Status: AC | PRN

## 2017-05-03 NOTE — ED Notes (Signed)
See provider note for assessment

## 2017-05-03 NOTE — ED Provider Notes (Signed)
[facility] Emergency Department Provider Note  ____________________________________________  Time seen: Approximately 11:29 PM  I have reviewed the triage vital signs and the nursing notes.   HISTORY  Chief Complaint Knee Pain (Billaterally)    HPI Kayla Christensen is a 51 y.o. female presents to the emergency department with 10 out of 10 bilateral knee pain. Patient denies falls or incidences of trauma. Patient has a history of knee osteoarthritis and reports that her pain is worsened with climbing stairs. Patient reports a feeling of instability. She describes pain as "sharp". She denies weakness, radiculopathy or changes in sensation of the lower extremities.   Past Medical History:  Diagnosis Date  . Hypertension     There are no active problems to display for this patient.   Past Surgical History:  Procedure Laterality Date  . THYROIDECTOMY    . TUBAL LIGATION      Prior to Admission medications   Medication Sig Start Date End Date Taking? Authorizing Provider  meloxicam (MOBIC) 15 MG tablet Take 1 tablet (15 mg total) by mouth daily. 06/08/16   Cuthriell, Delorise Royals, PA-C  traMADol (ULTRAM) 50 MG tablet Take 1 tablet (50 mg total) by mouth every 6 (six) hours as needed. 05/03/17 05/06/17  Orvil Feil, PA-C    Allergies Penicillins  No family history on file.  Social History Social History  Substance Use Topics  . Smoking status: Never Smoker  . Smokeless tobacco: Never Used  . Alcohol use No     Review of Systems  Constitutional: No fever/chills Eyes: No visual changes. No discharge ENT: No upper respiratory complaints. Cardiovascular: no chest pain. Respiratory: no cough. No SOB. Musculoskeletal: Patient has bilateral knee pain Skin: Negative for rash, abrasions, lacerations, ecchymosis. Neurological: Negative for headaches, focal weakness or numbness.   ____________________________________________   PHYSICAL  EXAM:  VITAL SIGNS: ED Triage Vitals  Enc Vitals Group     BP 05/03/17 2012 (!) 190/100     Pulse Rate 05/03/17 2012 74     Resp 05/03/17 2012 18     Temp 05/03/17 2012 99.1 F (37.3 C)     Temp Source 05/03/17 2012 Oral     SpO2 05/03/17 2012 96 %     Weight 05/03/17 2012 260 lb (117.9 kg)     Height 05/03/17 2012 5\' 9"  (1.753 m)     Head Circumference --      Peak Flow --      Pain Score 05/03/17 2016 9     Pain Loc --      Pain Edu? --      Excl. in GC? --      Constitutional: Alert and oriented. Well appearing and in no acute distress. Eyes: Conjunctivae are normal. PERRL. EOMI. Head: Atraumatic. Cardiovascular: Normal rate, regular rhythm. Normal S1 and S2.  Good peripheral circulation. Respiratory: Normal respiratory effort without tachypnea or retractions. Lungs CTAB. Good air entry to the bases with no decreased or absent breath sounds. Musculoskeletal: Patient is able to perform limited range of motion at the knees bilaterally secondary to pain. Negative anterior and posterior drawer test. No laxity with MCL or LCL testing. Negative ballottement. Palpable dorsalis pedis pulses bilaterally and symmetrically. Neurologic:  Normal speech and language. No gross focal neurologic deficits are appreciated.  Skin:  Skin is warm, dry and intact. No rash noted. Psychiatric: Mood and affect are normal. Speech and behavior are normal. Patient exhibits appropriate insight and judgement.   ____________________________________________  LABS (all labs ordered are listed, but only abnormal results are displayed)  Labs Reviewed - No data to display ____________________________________________  EKG   ____________________________________________  RADIOLOGY Geraldo PitterI, Jaclyn M Woods, personally viewed and evaluated these images (plain radiographs) as part of my medical decision making, as well as reviewing the written report by the radiologist.  Dg Knee Complete 4 Views Left  Result  Date: 05/03/2017 CLINICAL DATA:  Bilateral knee pain EXAM: LEFT KNEE - COMPLETE 4+ VIEW COMPARISON:  06/09/2014 FINDINGS: Hypertrophic spurring identified in all 3 compartments. Bones are diffusely demineralized. No fracture. No subluxation or dislocation. No substantial joint effusion. IMPRESSION: Tricompartmental degenerative change. Electronically Signed   By: Kennith CenterEric  Mansell M.D.   On: 05/03/2017 20:47   Dg Knee Complete 4 Views Right  Result Date: 05/03/2017 CLINICAL DATA:  Knee pain and swelling EXAM: RIGHT KNEE - COMPLETE 4+ VIEW COMPARISON:  None. FINDINGS: No fracture or malalignment. No large effusion. Minimal degenerative changes involving the medial compartment. IMPRESSION: No acute osseous abnormality. Electronically Signed   By: Jasmine PangKim  Fujinaga M.D.   On: 05/03/2017 20:47    ____________________________________________    PROCEDURES  Procedure(s) performed:    Procedures    Medications  traMADol (ULTRAM) tablet 50 mg (50 mg Oral Given 05/03/17 2149)     ____________________________________________   INITIAL IMPRESSION / ASSESSMENT AND PLAN / ED COURSE  Pertinent labs & imaging results that were available during my care of the patient were reviewed by me and considered in my medical decision making (see chart for details).  Review of the [place] CSRS was performed in accordance of the [facility] prior to dispensing any controlled drugs.     Assessment and plan Knee pain Differential diagnosis includes meniscal tear versus osteoarthritis versus fracture. Patient presents to the emergency department with bilateral knee pain. X-ray examination conducted in the emergency department is concerning for osteoarthritic changes. Ice was recommended for pain. Patient is currently taking Meloxicam. Patient was given tramadol in the emergency department. She was discharged with tramadol. Patient was advised to follow-up with orthopedics as needed. All patient questions were  answered.   ____________________________________________  FINAL CLINICAL IMPRESSION(S) / ED DIAGNOSES  Final diagnoses:  Chronic pain of both knees      NEW MEDICATIONS STARTED DURING THIS VISIT:  Discharge Medication List as of 05/03/2017  9:43 PM    START taking these medications   Details  traMADol (ULTRAM) 50 MG tablet Take 1 tablet (50 mg total) by mouth every 6 (six) hours as needed., Starting Mon 05/03/2017, Until Thu 05/06/2017, Print            This chart was dictated using voice recognition software/Dragon. Despite best efforts to proofread, errors can occur which can change the meaning. Any change was purely unintentional.    Orvil FeilWoods, Jaclyn M, PA-C 05/03/17 [phone]    Phineas SemenGoodman, [place], MD 05/03/17 [phone]

## 2017-05-03 NOTE — ED Triage Notes (Signed)
Pt ambulatory to triage with slow and steady gait  with complaints of bilaterally knee pain. Pt denies any other symptoms pt talks in complete sentences

## 2017-05-03 NOTE — ED Notes (Signed)
X-ray at bedside

## 2017-05-06 ENCOUNTER — Other Ambulatory Visit: Payer: Self-pay | Admitting: Orthopedic Surgery

## 2017-05-06 DIAGNOSIS — M25561 Pain in right knee: Secondary | ICD-10-CM

## 2017-05-13 ENCOUNTER — Ambulatory Visit
Admission: RE | Admit: 2017-05-13 | Discharge: 2017-05-13 | Disposition: A | Discharge status: Home / Self Care | Payer: PRIVATE HEALTH INSURANCE | Source: Ambulatory Visit | Attending: Orthopedic Surgery | Admitting: Orthopedic Surgery

## 2017-05-13 DIAGNOSIS — M949 Disorder of cartilage, unspecified: Secondary | ICD-10-CM | POA: Insufficient documentation

## 2017-05-13 DIAGNOSIS — S83241A Other tear of medial meniscus, current injury, right knee, initial encounter: Secondary | ICD-10-CM | POA: Insufficient documentation

## 2017-05-13 DIAGNOSIS — M25561 Pain in right knee: Secondary | ICD-10-CM | POA: Diagnosis present

## 2017-05-13 DIAGNOSIS — M25562 Pain in left knee: Secondary | ICD-10-CM | POA: Diagnosis not present

## 2017-05-13 DIAGNOSIS — X58XXXA Exposure to other specified factors, initial encounter: Secondary | ICD-10-CM | POA: Diagnosis not present

## 2018-09-09 ENCOUNTER — Other Ambulatory Visit: Payer: Self-pay

## 2018-09-09 ENCOUNTER — Emergency Department: Payer: PRIVATE HEALTH INSURANCE

## 2018-09-09 ENCOUNTER — Encounter: Payer: Self-pay | Admitting: Emergency Medicine

## 2018-09-09 ENCOUNTER — Inpatient Hospital Stay
Admission: EM | Admit: 2018-09-09 | Discharge: 2018-09-12 | DRG: 871 | Disposition: A | Discharge status: Home / Self Care | Payer: PRIVATE HEALTH INSURANCE | Attending: Family Medicine | Admitting: Family Medicine

## 2018-09-09 DIAGNOSIS — E039 Hypothyroidism, unspecified: Secondary | ICD-10-CM | POA: Diagnosis present

## 2018-09-09 DIAGNOSIS — M545 Low back pain: Secondary | ICD-10-CM | POA: Diagnosis present

## 2018-09-09 DIAGNOSIS — Z79899 Other long term (current) drug therapy: Secondary | ICD-10-CM | POA: Diagnosis not present

## 2018-09-09 DIAGNOSIS — D638 Anemia in other chronic diseases classified elsewhere: Secondary | ICD-10-CM | POA: Diagnosis present

## 2018-09-09 DIAGNOSIS — N179 Acute kidney failure, unspecified: Secondary | ICD-10-CM

## 2018-09-09 DIAGNOSIS — R739 Hyperglycemia, unspecified: Secondary | ICD-10-CM | POA: Diagnosis present

## 2018-09-09 DIAGNOSIS — Z79890 Hormone replacement therapy (postmenopausal): Secondary | ICD-10-CM | POA: Diagnosis not present

## 2018-09-09 DIAGNOSIS — E876 Hypokalemia: Secondary | ICD-10-CM | POA: Diagnosis present

## 2018-09-09 DIAGNOSIS — I10 Essential (primary) hypertension: Secondary | ICD-10-CM | POA: Diagnosis present

## 2018-09-09 DIAGNOSIS — Z791 Long term (current) use of non-steroidal anti-inflammatories (NSAID): Secondary | ICD-10-CM | POA: Diagnosis not present

## 2018-09-09 DIAGNOSIS — E669 Obesity, unspecified: Secondary | ICD-10-CM | POA: Diagnosis present

## 2018-09-09 DIAGNOSIS — J1000 Influenza due to other identified influenza virus with unspecified type of pneumonia: Secondary | ICD-10-CM | POA: Diagnosis present

## 2018-09-09 DIAGNOSIS — A419 Sepsis, unspecified organism: Principal | ICD-10-CM | POA: Diagnosis present

## 2018-09-09 DIAGNOSIS — J189 Pneumonia, unspecified organism: Secondary | ICD-10-CM | POA: Diagnosis present

## 2018-09-09 DIAGNOSIS — Z6833 Body mass index (BMI) 33.0-33.9, adult: Secondary | ICD-10-CM

## 2018-09-09 DIAGNOSIS — R0902 Hypoxemia: Secondary | ICD-10-CM

## 2018-09-09 DIAGNOSIS — E785 Hyperlipidemia, unspecified: Secondary | ICD-10-CM | POA: Diagnosis present

## 2018-09-09 DIAGNOSIS — J101 Influenza due to other identified influenza virus with other respiratory manifestations: Secondary | ICD-10-CM | POA: Diagnosis present

## 2018-09-09 LAB — COMPREHENSIVE METABOLIC PANEL
ALT: 27 U/L (ref 0–44)
AST: 35 U/L (ref 15–41)
Albumin: 3.6 g/dL (ref 3.5–5.0)
Alkaline Phosphatase: 49 U/L (ref 38–126)
Anion gap: 10 (ref 5–15)
BUN: 23 mg/dL — ABNORMAL HIGH (ref 6–20)
CO2: 27 mmol/L (ref 22–32)
Calcium: 8.1 mg/dL — ABNORMAL LOW (ref 8.9–10.3)
Chloride: 102 mmol/L (ref 98–111)
Creatinine, Ser: 1.78 mg/dL — ABNORMAL HIGH (ref 0.44–1.00)
GFR calc Af Amer: 37 mL/min — ABNORMAL LOW (ref >60)
GFR calc non Af Amer: 32 mL/min — ABNORMAL LOW (ref >60)
Glucose, Bld: 104 mg/dL — ABNORMAL HIGH (ref 70–99)
Potassium: 3.1 mmol/L — ABNORMAL LOW (ref 3.5–5.1)
Sodium: 139 mmol/L (ref 135–145)
Total Bilirubin: 0.5 mg/dL (ref 0.3–1.2)
Total Protein: 6.9 g/dL (ref 6.5–8.1)

## 2018-09-09 LAB — CBC WITH DIFFERENTIAL/PLATELET
ABS IMMATURE GRANULOCYTES: 0.01 10*3/uL (ref 0.00–0.07)
Basophils Absolute: 0 10*3/uL (ref 0.0–0.1)
Basophils Relative: 1 %
Eosinophils Absolute: 0 10*3/uL (ref 0.0–0.5)
Eosinophils Relative: 0 %
HCT: 36 % (ref 36.0–46.0)
Hemoglobin: 11.3 g/dL — ABNORMAL LOW (ref 12.0–15.0)
Immature Granulocytes: 0 %
Lymphocytes Relative: 17 %
Lymphs Abs: 0.7 10*3/uL (ref 0.7–4.0)
MCH: 26.5 pg (ref 26.0–34.0)
MCHC: 31.4 g/dL (ref 30.0–36.0)
MCV: 84.3 fL (ref 80.0–100.0)
Monocytes Absolute: 0.5 10*3/uL (ref 0.1–1.0)
Monocytes Relative: 12 %
Neutro Abs: 2.7 10*3/uL (ref 1.7–7.7)
Neutrophils Relative %: 70 %
Platelets: 173 10*3/uL (ref 150–400)
RBC: 4.27 MIL/uL (ref 3.87–5.11)
RDW: 16.4 % — ABNORMAL HIGH (ref 11.5–15.5)
WBC: 3.9 10*3/uL — ABNORMAL LOW (ref 4.0–10.5)
nRBC: 0 % (ref 0.0–0.2)

## 2018-09-09 LAB — INFLUENZA PANEL BY PCR (TYPE A & B)
Influenza A By PCR: POSITIVE — AB
Influenza B By PCR: NEGATIVE

## 2018-09-09 LAB — TROPONIN I: Troponin I: <0.03 ng/mL (ref <0.03)

## 2018-09-09 LAB — LACTIC ACID, PLASMA: Lactic Acid, Venous: 0.9 mmol/L (ref 0.5–1.9)

## 2018-09-09 MED ORDER — IPRATROPIUM-ALBUTEROL 0.5-2.5 (3) MG/3ML IN SOLN
3.0000 mL | Freq: Once | RESPIRATORY_TRACT | Status: AC
  Administered 2018-09-09: 3 mL via RESPIRATORY_TRACT
  Filled 2018-09-09: qty 3

## 2018-09-09 MED ORDER — VANCOMYCIN HCL IN DEXTROSE 1-5 GM/200ML-% IV SOLN: 1000.0000 mg | Freq: Once | INTRAVENOUS | Status: DC

## 2018-09-09 MED ORDER — OSELTAMIVIR PHOSPHATE 75 MG PO CAPS
75.0000 mg | ORAL_CAPSULE | Freq: Once | ORAL | Status: AC
  Administered 2018-09-09: 75 mg via ORAL
  Filled 2018-09-09: qty 1

## 2018-09-09 MED ORDER — SODIUM CHLORIDE 0.9 % IV SOLN
1.0000 g | Freq: Once | INTRAVENOUS | Status: AC
  Administered 2018-09-09: 1 g via INTRAVENOUS
  Filled 2018-09-09: qty 10

## 2018-09-09 MED ORDER — IPRATROPIUM-ALBUTEROL 0.5-2.5 (3) MG/3ML IN SOLN
RESPIRATORY_TRACT | Status: AC
  Administered 2018-09-09: 3 mL via RESPIRATORY_TRACT
  Filled 2018-09-09: qty 3

## 2018-09-09 MED ORDER — ACETAMINOPHEN 500 MG PO TABS
ORAL_TABLET | ORAL | Status: AC
  Administered 2018-09-09: 1000 mg via ORAL
  Filled 2018-09-09: qty 2

## 2018-09-09 MED ORDER — ONDANSETRON HCL 4 MG/2ML IJ SOLN
4.0000 mg | Freq: Once | INTRAMUSCULAR | Status: AC
  Administered 2018-09-09: 4 mg via INTRAVENOUS
  Filled 2018-09-09: qty 2

## 2018-09-09 MED ORDER — ACETAMINOPHEN 500 MG PO TABS
1000.0000 mg | ORAL_TABLET | Freq: Once | ORAL | Status: AC
  Administered 2018-09-09: 1000 mg via ORAL
  Filled 2018-09-09: qty 2

## 2018-09-09 MED ORDER — KETOROLAC TROMETHAMINE 30 MG/ML IJ SOLN
15.0000 mg | Freq: Once | INTRAMUSCULAR | Status: AC
  Administered 2018-09-09: 15 mg via INTRAVENOUS
  Filled 2018-09-09: qty 1

## 2018-09-09 MED ORDER — SODIUM CHLORIDE 0.9 % IV BOLUS
1000.0000 mL | Freq: Once | INTRAVENOUS | Status: AC
  Administered 2018-09-09: 1000 mL via INTRAVENOUS

## 2018-09-09 MED ORDER — SODIUM CHLORIDE 0.9 % IV SOLN
500.0000 mg | Freq: Once | INTRAVENOUS | Status: AC
  Administered 2018-09-09: 500 mg via INTRAVENOUS
  Filled 2018-09-09: qty 500

## 2018-09-09 NOTE — ED Triage Notes (Signed)
Cough, fever, sinus congestion x 3 days.    OTC cold and flu medication taken last at 0100.

## 2018-09-09 NOTE — ED Provider Notes (Signed)
[facility] Emergency Department Provider Note  Time seen: 10:42 PM  I have reviewed the triage vital signs and the nursing notes.   HISTORY  Chief Complaint Fever and Cough    HPI Kayla Christensen is a 53 y.o. female with a past medical history of hypertension presents to the emergency department for cough, body aches, fever shortness of breath.  According to the patient for the past 3 to 4 days she has been coughing with subjective fevers.  States she has a headache, shortness of breath at times especially with exertion.  Denies any chest pain.  States she thought she had a sinus infection, but states the headache and body aches continued to worsen so she came to the emergency department for evaluation.  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   Past Medical History:  Diagnosis Date  . Hypertension     There are no active problems to display for this patient.   Past Surgical History:  Procedure Laterality Date  . THYROIDECTOMY    . TUBAL LIGATION      Prior to Admission medications   Medication Sig Start Date End Date Taking? Authorizing Provider  meloxicam (MOBIC) 15 MG tablet Take 1 tablet (15 mg total) by mouth daily. 06/08/16   Cuthriell, Delorise Royals, PA-C    Allergies  Allergen Reactions  . Penicillins     No family history on file.  Social History Social History   Tobacco Use  . Smoking status: Never Smoker  . Smokeless tobacco: Never Used  Substance Use Topics  . Alcohol use: No  . Drug use: No    Review of Systems Constitutional: Subjective fever at home ENT: Positive for congestion and pressure Cardiovascular: Negative for chest pain. Respiratory: Positive for shortness of breath worse with exertion. Gastrointestinal: Negative for abdominal pain, vomiting  Musculoskeletal: Negative for leg pain or swelling Skin: Negative for skin complaints  Neurological: Moderate headache All other ROS  negative  ____________________________________________   PHYSICAL EXAM:  VITAL SIGNS: ED Triage Vitals  Enc Vitals Group     BP 09/09/18 1844 (!) 168/81     Pulse Rate 09/09/18 1844 68     Resp 09/09/18 1844 18     Temp 09/09/18 1844 (!) 101.6 F (38.7 C)     Temp Source 09/09/18 1844 Oral     SpO2 --      Weight 09/09/18 1838 259 lb 14.8 oz (117.9 kg)     Height --      Head Circumference --      Peak Flow --      Pain Score --      Pain Loc --      Pain Edu? --      Excl. in GC? --    Constitutional: Alert and oriented. Well appearing and in no distress. Eyes: Normal exam ENT   Head: Normocephalic and atraumatic.   Mouth/Throat: Mucous membranes are moist. Cardiovascular: Normal rate, regular rhythm. Respiratory: Normal respiratory effort without tachypnea nor retractions. Breath sounds are clear  Gastrointestinal: Soft and nontender. No distention.   Musculoskeletal: Nontender with normal range of motion in all extremities Neurologic:  Normal speech and language. No gross focal neurologic deficits  Skin:  Skin is warm, dry and intact.  Psychiatric: Mood and affect are normal.   ____________________________________________   RADIOLOGY  Patchy bilateral airspace opacities.  ____________________________________________   INITIAL IMPRESSION / ASSESSMENT AND PLAN / ED COURSE  Pertinent labs &  imaging results that were available during my care of the patient were reviewed by me and considered in my medical decision making (see chart for details).  Patient presents to the emergency department for cough, body aches, subjective fever mild shortness of breath.  Patient's influenza screen is positive for influenza A which explains the majority of the patient's symptoms.  Her chest x-ray however does show patchy bilateral airspace opacities concerning for pneumonia.  We will check labs and continue to closely monitor.  Patient currently on oxygen but has no home O2  requirement.  We will take off of oxygen to see what her room air saturation is currently.  If the patient is hypoxic she will need admission to the hospital.  Overall she appears rather well at this time.  Patient desats to 88-89% on room air.  Given her new hypoxia with patchy bilateral opacities will cover with IV antibiotics, cover with Tamiflu and admit to the hospitalist service.  Patient agreeable plan of care.  ____________________________________________   FINAL CLINICAL IMPRESSION(S) / ED DIAGNOSES  Influenza Pneumonia Hypoxia   Minna Antis, MD 09/09/18 2321

## 2018-09-09 NOTE — ED Notes (Signed)
Initial RA sats 88-89%.  Lung fields diminished throughout, right greater than left.  Strong cough noted, and after cough, sats improved to 94%, but quickelyreturned to 89%.  Duoneb given.  Continue to monitor.

## 2018-09-10 ENCOUNTER — Inpatient Hospital Stay: Payer: PRIVATE HEALTH INSURANCE

## 2018-09-10 LAB — STREP PNEUMONIAE URINARY ANTIGEN: Strep Pneumo Urinary Antigen: NEGATIVE

## 2018-09-10 LAB — PHOSPHORUS: Phosphorus: 3.9 mg/dL (ref 2.5–4.6)

## 2018-09-10 LAB — PROCALCITONIN: Procalcitonin: 0.24 ng/mL

## 2018-09-10 LAB — NA AND K (SODIUM & POTASSIUM), RAND UR
Potassium Urine: 49 mmol/L
Sodium, Ur: 55 mmol/L

## 2018-09-10 LAB — TSH: TSH: 100 u[IU]/mL — AB (ref 0.350–4.500)

## 2018-09-10 LAB — PROTEIN, URINE, RANDOM: Total Protein, Urine: 72 mg/dL

## 2018-09-10 LAB — MAGNESIUM: MAGNESIUM: 1.9 mg/dL (ref 1.7–2.4)

## 2018-09-10 LAB — CREATININE, URINE, RANDOM: CREATININE, URINE: 265 mg/dL

## 2018-09-10 MED ORDER — IPRATROPIUM-ALBUTEROL 0.5-2.5 (3) MG/3ML IN SOLN
3.0000 mL | Freq: Once | RESPIRATORY_TRACT | Status: AC
  Administered 2018-09-10: 3 mL via RESPIRATORY_TRACT
  Filled 2018-09-10: qty 3

## 2018-09-10 MED ORDER — BISACODYL 5 MG PO TBEC: 5.0000 mg | DELAYED_RELEASE_TABLET | Freq: Every day | ORAL | Status: DC | PRN

## 2018-09-10 MED ORDER — NABUMETONE 500 MG PO TABS
500.0000 mg | ORAL_TABLET | Freq: Two times a day (BID) | ORAL | Status: DC
  Administered 2018-09-10 – 2018-09-12 (×4): 500 mg via ORAL
  Filled 2018-09-10 (×6): qty 1

## 2018-09-10 MED ORDER — MORPHINE SULFATE (PF) 2 MG/ML IV SOLN: 2.0000 mg | INTRAVENOUS | Status: DC | PRN

## 2018-09-10 MED ORDER — SODIUM CHLORIDE 0.9 % IV SOLN
1.0000 g | INTRAVENOUS | Status: DC
  Administered 2018-09-10: 1 g via INTRAVENOUS
  Filled 2018-09-10: qty 10
  Filled 2018-09-10: qty 1

## 2018-09-10 MED ORDER — ENOXAPARIN SODIUM 40 MG/0.4ML ~~LOC~~ SOLN
40.0000 mg | SUBCUTANEOUS | Status: DC
  Administered 2018-09-10 – 2018-09-11 (×2): 40 mg via SUBCUTANEOUS
  Filled 2018-09-10 (×2): qty 0.4

## 2018-09-10 MED ORDER — ONDANSETRON HCL 4 MG PO TABS: 4.0000 mg | ORAL_TABLET | Freq: Four times a day (QID) | ORAL | Status: DC | PRN

## 2018-09-10 MED ORDER — ACETAMINOPHEN 325 MG PO TABS
650.0000 mg | ORAL_TABLET | Freq: Four times a day (QID) | ORAL | Status: DC | PRN
  Administered 2018-09-10 – 2018-09-11 (×2): 650 mg via ORAL
  Filled 2018-09-10 (×2): qty 2

## 2018-09-10 MED ORDER — LISINOPRIL-HYDROCHLOROTHIAZIDE 20-12.5 MG PO TABS: 1.0000 | ORAL_TABLET | Freq: Every day | ORAL | Status: DC

## 2018-09-10 MED ORDER — POTASSIUM CHLORIDE CRYS ER 20 MEQ PO TBCR
40.0000 meq | EXTENDED_RELEASE_TABLET | Freq: Once | ORAL | Status: AC
  Administered 2018-09-10: 40 meq via ORAL
  Filled 2018-09-10: qty 2

## 2018-09-10 MED ORDER — SODIUM CHLORIDE 0.9 % IV SOLN
500.0000 mg | INTRAVENOUS | Status: DC
  Administered 2018-09-10: 500 mg via INTRAVENOUS
  Filled 2018-09-10 (×2): qty 500

## 2018-09-10 MED ORDER — ATORVASTATIN CALCIUM 20 MG PO TABS
40.0000 mg | ORAL_TABLET | Freq: Every day | ORAL | Status: DC
  Administered 2018-09-10 – 2018-09-11 (×2): 40 mg via ORAL
  Filled 2018-09-10 (×2): qty 2

## 2018-09-10 MED ORDER — SENNOSIDES-DOCUSATE SODIUM 8.6-50 MG PO TABS: 1.0000 | ORAL_TABLET | Freq: Every evening | ORAL | Status: DC | PRN

## 2018-09-10 MED ORDER — OSELTAMIVIR PHOSPHATE 75 MG PO CAPS
75.0000 mg | ORAL_CAPSULE | Freq: Two times a day (BID) | ORAL | Status: DC
  Administered 2018-09-10 – 2018-09-12 (×5): 75 mg via ORAL
  Filled 2018-09-10 (×5): qty 1

## 2018-09-10 MED ORDER — HYDROCHLOROTHIAZIDE 12.5 MG PO CAPS
12.5000 mg | ORAL_CAPSULE | Freq: Every day | ORAL | Status: DC
  Administered 2018-09-10 – 2018-09-12 (×3): 12.5 mg via ORAL
  Filled 2018-09-10 (×3): qty 1

## 2018-09-10 MED ORDER — ACETAMINOPHEN 650 MG RE SUPP: 650.0000 mg | Freq: Four times a day (QID) | RECTAL | Status: DC | PRN

## 2018-09-10 MED ORDER — LISINOPRIL 20 MG PO TABS
20.0000 mg | ORAL_TABLET | Freq: Every day | ORAL | Status: DC
  Administered 2018-09-10 – 2018-09-12 (×3): 20 mg via ORAL
  Filled 2018-09-10 (×3): qty 1

## 2018-09-10 MED ORDER — LACTATED RINGERS IV SOLN: INTRAVENOUS | Status: DC

## 2018-09-10 MED ORDER — LEVOTHYROXINE SODIUM 50 MCG PO TABS
200.0000 ug | ORAL_TABLET | ORAL | Status: DC
  Administered 2018-09-11: 200 ug via ORAL
  Filled 2018-09-10: qty 4

## 2018-09-10 MED ORDER — ALUM & MAG HYDROXIDE-SIMETH 200-200-20 MG/5ML PO SUSP
30.0000 mL | ORAL | Status: DC | PRN
  Administered 2018-09-10: 30 mL via ORAL
  Filled 2018-09-10: qty 30

## 2018-09-10 MED ORDER — CARVEDILOL 25 MG PO TABS
25.0000 mg | ORAL_TABLET | Freq: Two times a day (BID) | ORAL | Status: DC
  Administered 2018-09-10 – 2018-09-12 (×4): 25 mg via ORAL
  Filled 2018-09-10 (×4): qty 1

## 2018-09-10 MED ORDER — ONDANSETRON HCL 4 MG/2ML IJ SOLN: 4.0000 mg | Freq: Four times a day (QID) | INTRAMUSCULAR | Status: DC | PRN

## 2018-09-10 MED ORDER — LACTATED RINGERS IV SOLN
INTRAVENOUS | Status: DC
  Administered 2018-09-10 – 2018-09-12 (×2): via INTRAVENOUS

## 2018-09-10 MED ORDER — VANCOMYCIN HCL 10 G IV SOLR
2000.0000 mg | Freq: Once | INTRAVENOUS | Status: AC
  Administered 2018-09-10: 2000 mg via INTRAVENOUS
  Filled 2018-09-10: qty 2000

## 2018-09-10 MED ORDER — VANCOMYCIN HCL IN DEXTROSE 1-5 GM/200ML-% IV SOLN
1000.0000 mg | INTRAVENOUS | Status: DC
  Administered 2018-09-11: 1000 mg via INTRAVENOUS
  Filled 2018-09-10 (×2): qty 200

## 2018-09-10 MED ORDER — MORPHINE SULFATE (PF) 4 MG/ML IV SOLN: 4.0000 mg | INTRAVENOUS | Status: DC | PRN

## 2018-09-10 NOTE — Progress Notes (Signed)
[facility] - [place] at [facility]   PATIENT NAME: Kayla Christensen    MR#:  [id number]  DATE OF BIRTH:  [birth year]  SUBJECTIVE:  CHIEF COMPLAINT:   Patient without complaint, continue IV fluids for rehydration REVIEW OF SYSTEMS:  CONSTITUTIONAL: No fever, fatigue or weakness.  EYES: No blurred or double vision.  EARS, NOSE, AND THROAT: No tinnitus or ear pain.  RESPIRATORY: No cough, shortness of breath, wheezing or hemoptysis.  CARDIOVASCULAR: No chest pain, orthopnea, edema.  GASTROINTESTINAL: No nausea, vomiting, diarrhea or abdominal pain.  GENITOURINARY: No dysuria, hematuria.  ENDOCRINE: No polyuria, nocturia,  HEMATOLOGY: No anemia, easy bruising or bleeding SKIN: No rash or lesion. MUSCULOSKELETAL: No joint pain or arthritis.   NEUROLOGIC: No tingling, numbness, weakness.  PSYCHIATRY: No anxiety or depression.   ROS  DRUG ALLERGIES:   Allergies  Allergen Reactions  . Penicillins     VITALS:  Blood pressure (!) 154/81, pulse (!) 52, temperature (!) 97.5 F (36.4 C), temperature source Oral, resp. rate 18, height 5' 7.76" (1.721 m), weight 117.9 kg, SpO2 95 %.  PHYSICAL EXAMINATION:  GENERAL:  53 y.o.-year-old patient lying in the bed with no acute distress.  EYES: Pupils equal, round, reactive to light and accommodation. No scleral icterus. Extraocular muscles intact.  HEENT: Head atraumatic, normocephalic. Oropharynx and nasopharynx clear.  NECK:  Supple, no jugular venous distention. No thyroid enlargement, no tenderness.  LUNGS: Normal breath sounds bilaterally, no wheezing, rales,rhonchi or crepitation. No use of accessory muscles of respiration.  CARDIOVASCULAR: S1, S2 normal. No murmurs, rubs, or gallops.  ABDOMEN: Soft, nontender, nondistended. Bowel sounds present. No organomegaly or mass.  EXTREMITIES: No pedal edema, cyanosis, or clubbing.  NEUROLOGIC: Cranial nerves II through XII are intact. Muscle strength 5/5 in all extremities.  Sensation intact. Gait not checked.  PSYCHIATRIC: The patient is alert and oriented x 3.  SKIN: No obvious rash, lesion, or ulcer.   Physical Exam LABORATORY PANEL:   CBC Recent Labs  Lab 09/09/18 2226  WBC 3.9*  HGB 11.3*  HCT 36.0  PLT 173   ------------------------------------------------------------------------------------------------------------------  Chemistries  Recent Labs  Lab 09/09/18 2226  NA 139  K 3.1*  CL 102  CO2 27  GLUCOSE 104*  BUN 23*  CREATININE 1.78*  CALCIUM 8.1*  MG 1.9  AST 35  ALT 27  ALKPHOS 49  BILITOT 0.5   ------------------------------------------------------------------------------------------------------------------  Cardiac Enzymes Recent Labs  Lab 09/09/18 2226  TROPONINI <0.03   ------------------------------------------------------------------------------------------------------------------  RADIOLOGY:  Dg Chest 2 View  Result Date: 09/09/2018 CLINICAL DATA:  Cough, fever, sinus congestion EXAM: CHEST - 2 VIEW COMPARISON:  04/21/2015 FINDINGS: Mild cardiomegaly. Interstitial prominence and patchy airspace disease in the lungs bilaterally concerning for pneumonia. Atypical or mycoplasma pneumonia possible. No effusions or acute bony abnormality. IMPRESSION: Interstitial prominence and patchy bilateral airspace opacities concerning for pneumonia. Mild cardiomegaly. Electronically Signed   By: Charlett Nose M.D.   On: 09/09/2018 20:03   US Renal  Result Date: 09/10/2018 CLINICAL DATA:  Acute kidney injury. EXAM: RENAL / URINARY TRACT ULTRASOUND COMPLETE COMPARISON:  CT abdomen and pelvis 01/20/2014 FINDINGS: Right Kidney: Renal measurements: 10.5 x 4.5 x 4.4 cm = volume: 107 mL . Echogenicity within normal limits. No mass or hydronephrosis visualized. Left Kidney: Renal measurements: 9.1 x 5.2 x 4.4 cm = volume: 108 mL. Echogenicity within normal limits. No mass or hydronephrosis visualized. Bladder: Appears normal for degree of  bladder distention. IMPRESSION: Normal examination. No hydronephrosis in either kidney. Bladder  is unremarkable. Electronically Signed   By: Burman Nieves M.D.   On: 09/10/2018 02:54    ASSESSMENT AND PLAN:  *Acute sepsis secondary to bilateral pneumonia and influenza A infection Continue sepsis protocol, empiric Rocephin/azithromycin/vancomycin, Tamiflu, IV fluids for rehydration, follow-up on cultures  *Acute bilateral pneumonia Most likely secondary to acute influenza infection Empiric antibiotics per above, follow-up on cultures, pneumonia protocol  *Acute influenza A infection Stable Tamiflu for 5-day course, continue supportive care  *Chronic hypothyroidism, unspecified Continue Synthroid-we will need to have TSH rechecked in 4 to 6 weeks for reevaluation TSH greater than 100,000  *Chronic obesity Most likely secondary to excess calories Lifestyle modification recommended  Disposition to home in 1-3 days pending clinical course  All the records are reviewed and case discussed with Care Management/Social Workerr. Management plans discussed with the patient, family and they are in agreement.  CODE STATUS: full  TOTAL TIME TAKING CARE OF THIS PATIENT: 55 minutes.   POSSIBLE D/C IN 1-3 DAYS, DEPENDING ON CLINICAL CONDITION.   Evelena Asa Salary M.D on 09/10/2018   Between 7am to 6pm - Pager - [phone]  After 6pm go to [email or web address] - password [email or web address]  [facility]  Office  [phone]  CC: Primary care physician; Margorie John, MD  Note: This dictation was prepared with Dragon dictation along with smaller phrase technology. Any transcriptional errors that result from this process are unintentional.

## 2018-09-10 NOTE — Consult Note (Signed)
CODE SEPSIS - PHARMACY COMMUNICATION  **Broad Spectrum Antibiotics should be administered within 1 hour of Sepsis diagnosis**  Time Code Sepsis Called/Page Received: 1343  Antibiotics Ordered: Ceftriaxone/Azithromycin  Time of 1st antibiotic administration: 1537  Additional action taken by pharmacy: Called to check on admin ~ 1hr mark - was informed pt had lost IV access - and they were trying to re-obtain  If necessary, Name of Provider/Nurse Contacted: Amy   Albina Billet, PharmD, BCPS Clinical Pharmacist 09/10/2018 2:55 PM

## 2018-09-10 NOTE — H&P (Addendum)
[facility] - [place] at [facility]   PATIENT NAME: Kayla Christensen    MR#:  [id number]  DATE OF BIRTH:  Jan 12, 1966  DATE OF ADMISSION:  09/09/2018  PRIMARY CARE PHYSICIAN: Margorie John, MD   REQUESTING/REFERRING PHYSICIAN: Minna Antis, MD  CHIEF COMPLAINT:   Chief Complaint  Patient presents with  . Fever  . Cough    HISTORY OF PRESENT ILLNESS:  Kayla Christensen  is a 53 y.o. female with a known history of obesity, HTN, HLD, hypothyroidism p/w pneumonia. Endorses onset of symptoms Tues (2/25), URI + sinusitis symptoms (facial pain, sinus congestion, rhinorrhea, cough productive of yellow/brown sputum, post-nasal drip). Took some OTC medications w/ little improvement, but states she took a second OTC remedy that helped. Fri (2/28) however, pt endorses profound generalized weakness and mid-lower back pain along the midline. Endorses fever + chills, but denies rigors. Endorses diaphoresis, but denies night sweats. Endorses headache and pain in the B/L ears. Lung sounds diminished, however I did not appreciate any adventitious lung sounds. Non-smoker. Works at a nursing home. Flu A (+). CXR (+) "Interstitial prominence and patchy bilateral airspace opacities concerning for pneumonia." Febrile, hypoxic (88% on RA, improved to 94-96% on 2L [place]), WBC < 4.0, SIRS (+), (+) sepsis.  PAST MEDICAL HISTORY:   Past Medical History:  Diagnosis Date  . Hypertension     PAST SURGICAL HISTORY:   Past Surgical History:  Procedure Laterality Date  . THYROIDECTOMY    . TUBAL LIGATION      SOCIAL HISTORY:   Social History   Tobacco Use  . Smoking status: Never Smoker  . Smokeless tobacco: Never Used  Substance Use Topics  . Alcohol use: No    FAMILY HISTORY:  History reviewed. No pertinent family history.  DRUG ALLERGIES:   Allergies  Allergen Reactions  . Penicillins     REVIEW OF SYSTEMS:   Review of Systems  Constitutional: Positive for  chills, diaphoresis, fever and malaise/fatigue. Negative for weight loss.  HENT: Positive for congestion, ear pain, sinus pain and sore throat. Negative for hearing loss, nosebleeds and tinnitus.   Eyes: Negative for blurred vision, double vision and photophobia.  Respiratory: Positive for cough and sputum production. Negative for hemoptysis, shortness of breath and wheezing.   Cardiovascular: Negative for chest pain, palpitations, orthopnea, claudication, leg swelling and PND.  Gastrointestinal: Negative for abdominal pain, blood in stool, constipation, diarrhea, heartburn, melena, nausea and vomiting.  Genitourinary: Negative for dysuria, frequency, hematuria and urgency.  Musculoskeletal: Positive for back pain. Negative for joint pain, myalgias and neck pain.  Skin: Negative for itching and rash.  Neurological: Positive for weakness and headaches. Negative for dizziness, tingling, tremors, sensory change, speech change, focal weakness, seizures and loss of consciousness.  Psychiatric/Behavioral: Negative for depression and memory loss. The patient is not nervous/anxious and does not have insomnia.    MEDICATIONS AT HOME:   Prior to Admission medications   Medication Sig Start Date End Date Taking? Authorizing Provider  atorvastatin (LIPITOR) 40 MG tablet Take 40 mg by mouth daily. 03/22/18  Yes [provider]  carvedilol (COREG) 25 MG tablet Take 25 mg by mouth 2 (two) times daily with a meal. 03/22/18  Yes [provider]  levothyroxine (SYNTHROID, LEVOTHROID) 200 MCG tablet Take 1 tablet by mouth every morning. 04/16/18  Yes [provider]  lisinopril-hydrochlorothiazide (PRINZIDE,ZESTORETIC) 20-12.5 MG tablet Take 1 tablet by mouth daily. 03/22/18  Yes [provider]  Multiple Vitamin (MULTIVITAMIN) capsule  Take 1 capsule by mouth daily. 12/18/10  Yes [provider]  nabumetone (RELAFEN) 500 MG tablet Take 500 mg by mouth 2 (two) times daily.  03/22/18  Yes [provider]      VITAL SIGNS:  Blood pressure (!) 168/81, pulse 68, temperature (!) 101.6 F (38.7 C), temperature source Oral, resp. rate 18, weight 117.9 kg, SpO2 90 %.  PHYSICAL EXAMINATION:  Physical Exam Constitutional:      General: She is not in acute distress.    Appearance: She is ill-appearing and toxic-appearing. She is not diaphoretic.     Interventions: She is not intubated. HENT:     Head: Atraumatic.     Mouth/Throat:     Pharynx: Oropharynx is clear.  Eyes:     General: No scleral icterus.    Extraocular Movements: Extraocular movements intact.     Conjunctiva/sclera: Conjunctivae normal.  Neck:     Musculoskeletal: Neck supple.  Cardiovascular:     Rate and Rhythm: Normal rate and regular rhythm.     Heart sounds: Normal heart sounds. No murmur. No friction rub. No gallop.   Pulmonary:     Effort: Pulmonary effort is normal. No tachypnea, bradypnea, accessory muscle usage, respiratory distress or retractions. She is not intubated.     Breath sounds: No stridor, decreased air movement or transmitted upper airway sounds. Examination of the right-upper field reveals decreased breath sounds. Examination of the left-upper field reveals decreased breath sounds. Examination of the right-middle field reveals decreased breath sounds. Examination of the left-middle field reveals decreased breath sounds. Examination of the right-lower field reveals decreased breath sounds. Examination of the left-lower field reveals decreased breath sounds. Decreased breath sounds present. No wheezing, rhonchi or rales.  Abdominal:     General: There is no distension.     Palpations: Abdomen is soft.     Tenderness: There is no abdominal tenderness. There is no guarding or rebound.  Musculoskeletal: Normal range of motion.        General: No swelling or tenderness.     Right lower leg: No edema.     Left lower leg: No edema.  Lymphadenopathy:     Cervical: No  cervical adenopathy.  Skin:    General: Skin is warm and dry.     Findings: No erythema or rash.  Neurological:     Mental Status: She is alert and oriented to person, place, and time. Mental status is at baseline.  Psychiatric:        Mood and Affect: Mood normal.        Behavior: Behavior normal.        Thought Content: Thought content normal.        Judgment: Judgment normal.    Diffusely diminished; no adventitious lung sounds appreciated on examination. LABORATORY PANEL:   CBC Recent Labs  Lab 09/09/18 2226  WBC 3.9*  HGB 11.3*  HCT 36.0  PLT 173   ------------------------------------------------------------------------------------------------------------------  Chemistries  Recent Labs  Lab 09/09/18 2226  NA 139  K 3.1*  CL 102  CO2 27  GLUCOSE 104*  BUN 23*  CREATININE 1.78*  CALCIUM 8.1*  AST 35  ALT 27  ALKPHOS 49  BILITOT 0.5   ------------------------------------------------------------------------------------------------------------------  Cardiac Enzymes Recent Labs  Lab 09/09/18 2226  TROPONINI <0.03   ------------------------------------------------------------------------------------------------------------------  RADIOLOGY:  Dg Chest 2 View  Result Date: 09/09/2018 CLINICAL DATA:  Cough, fever, sinus congestion EXAM: CHEST - 2 VIEW COMPARISON:  04/21/2015 FINDINGS: Mild cardiomegaly. Interstitial prominence and  patchy airspace disease in the lungs bilaterally concerning for pneumonia. Atypical or mycoplasma pneumonia possible. No effusions or acute bony abnormality. IMPRESSION: Interstitial prominence and patchy bilateral airspace opacities concerning for pneumonia. Mild cardiomegaly. Electronically Signed   By: Charlett Nose M.D.   On: 09/09/2018 20:03   IMPRESSION AND PLAN:   A/P: 57F Influenza A, sepsis, pneumonia, acute hypoxemic respiratory failure. AKI. Hypokalemia, hyperglycemia, leukopenia (w/o neutropenia), normocytic  anemia. -Influenza A, sepsis, pneumonia, acute hypoxemic respiratory failure, leukopenia (w/o neutropenia): Flu A (+). CXR (+) "Interstitial prominence and patchy bilateral airspace opacities concerning for pneumonia." Febrile, hypoxic (88% on RA, improved to 94-96% on 2L [place]), WBC < 4.0 (ANC 2700), SIRS (+), (+) sepsis. Facility employee p/w Flu, CXR w/ infiltrates, suspect superinfxn (acute bacterial pneumonia). Given that pt is employed at a facility, I am reluctant to refer to her present malady as "community-acquired" (not that such a distinction matters much anymore, given the recent update in the ATS/IDSA guidelines). At any rate, given the pt's disease severity, I am electing to treat aggressively, with Vancomycin in addition to Ceftriaxone and Azithromycin. Will also c/w Tamiflu. BCx, sputum gram/Cx, UStrep + ULeg Ag, PCT pending. Lac 0.9. Nebs for mobilization of secretions. IVF, pain ctrl. Pulse ox, O2. -AKI: Urine studies (electrolytes, protein, creatinine, urea nitrogen), renal U/S. IVF. Monitor BMP, avoid nephrotoxins. -Hypokalemia: Replete, monitor BMP. Mag pending. -Normocytic anemia: Likely anemia of chronic disease. Stable when compared to prior labwork. No evidence of active/acute blood loss at present time. -c/w home meds/formulary subs as tolerated. -FEN/GI: Cardiac diet as tolerated. -DVT PPx: Lovenox. -Code status: Full code. -Disposition: Admission, > 2 midnights.   All the records are reviewed and case discussed with ED provider. Management plans discussed with the patient, family and they are in agreement.  CODE STATUS: Full code.  TOTAL TIME TAKING CARE OF THIS PATIENT: 75 minutes.    Barbaraann Rondo M.D on 09/10/2018 at 1:14 AM  Between 7am to 6pm - Pager - [phone]  After 6pm go to [email or web address] - Social research officer, government  [facility]  Office  [phone]  CC: Primary care physician; Margorie John, MD   Note: This dictation was  prepared with Dragon dictation along with smaller phrase technology. Any transcriptional errors that result from this process are unintentional.

## 2018-09-10 NOTE — ED Notes (Signed)
Pt resting quietly on ER stretcher, NAD noted, VSS.  Pt son at bedside, provided recliner and pillow.  Pt repositioned for comfort.  Call bell in reach.  Pt voices no other needs or c/o at this time.  Awaiting inpatient bed placement at this time.  PT and family aware of POC and delays.  Will continue to monitor.

## 2018-09-10 NOTE — ED Notes (Signed)
Pt ambulatory to BR without difficulty.  Pt states feeling better than previously.  Will continue to monitor.

## 2018-09-10 NOTE — ED Notes (Signed)
ED TO INPATIENT HANDOFF REPORT  ED Nurse Name and Phone #: Victorino Dike [phone]  S Name/Age/Gender Michail Jewels 53 y.o. female Room/Bed: ED13A/ED13A  Code Status   Code Status: Not on file  Home/SNF/Other Home Patient oriented to: self, place, time and situation Is this baseline? Yes   Triage Complete: Triage complete  Chief Complaint Flu like Symptoms  Triage Note Cough, fever, sinus congestion x 3 days.    OTC cold and flu medication taken last at 0100.   Allergies Allergies  Allergen Reactions  . Penicillins     Level of Care/Admitting Diagnosis ED Disposition    ED Disposition Condition Comment   Admit  Hospital Area: [facility] [[id number]]  Level of Care: Telemetry [5]  Diagnosis: Sepsis due to pneumonia [facility]) [[id number]]  Admitting Physician: Barbaraann Rondo [[id number]]  Attending Physician: Barbaraann Rondo [[id number]]  Estimated length of stay: past midnight tomorrow  Certification:: I certify this patient will need inpatient services for at least 2 midnights  PT Class (Do Not Modify): Inpatient [101]  PT Acc Code (Do Not Modify): Private [1]       B Medical/Surgery History Past Medical History:  Diagnosis Date  . Hypertension    Past Surgical History:  Procedure Laterality Date  . THYROIDECTOMY    . TUBAL LIGATION       A IV Location/Drains/Wounds Patient Lines/Drains/Airways Status   Active Line/Drains/Airways    Name:   Placement date:   Placement time:   Site:   Days:   Peripheral IV 09/09/18 Left Forearm   09/09/18    2226    Forearm   1          Intake/Output Last 24 hours  Intake/Output Summary (Last 24 hours) at 09/10/2018 1252 Last data filed at 09/10/2018 1158 Gross per 24 hour  Intake -  Output 500 ml  Net -500 ml    Labs/Imaging Results for orders placed or performed during the hospital encounter of 09/09/18 (from the past 48 hour(s))  Influenza panel by PCR (type A & B)     Status:  Abnormal   Collection Time: 09/09/18  6:56 PM  Result Value Ref Range   Influenza A By PCR POSITIVE (A) NEGATIVE   Influenza B By PCR NEGATIVE NEGATIVE    Comment: (NOTE) The Xpert Xpress Flu assay is intended as an aid in the diagnosis of  influenza and should not be used as a sole basis for treatment.  This  assay is FDA approved for nasopharyngeal swab specimens only. Nasal  washings and aspirates are unacceptable for Xpert Xpress Flu testing. Performed at [facility], [address] [place], [place] [address]   CBC with Differential     Status: Abnormal   Collection Time: 09/09/18 10:26 PM  Result Value Ref Range   WBC 3.9 (L) 4.0 - 10.5 K/uL   RBC 4.27 3.87 - 5.11 MIL/uL   Hemoglobin 11.3 (L) 12.0 - 15.0 g/dL   HCT 46.9 62.9 - 52.8 %   MCV 84.3 80.0 - 100.0 fL   MCH 26.5 26.0 - 34.0 pg   MCHC 31.4 30.0 - 36.0 g/dL   RDW 41.3 (H) 24.4 - 01.0 %   Platelets 173 150 - 400 K/uL   nRBC 0.0 0.0 - 0.2 %   Neutrophils Relative % 70 %   Neutro Abs 2.7 1.7 - 7.7 K/uL   Lymphocytes Relative 17 %   Lymphs Abs 0.7 0.7 - 4.0 K/uL   Monocytes Relative  12 %   Monocytes Absolute 0.5 0.1 - 1.0 K/uL   Eosinophils Relative 0 %   Eosinophils Absolute 0.0 0.0 - 0.5 K/uL   Basophils Relative 1 %   Basophils Absolute 0.0 0.0 - 0.1 K/uL   Immature Granulocytes 0 %   Abs Immature Granulocytes 0.01 0.00 - 0.07 K/uL    Comment: Performed at [facility], [address] [place], [place] [address]  Comprehensive metabolic panel     Status: Abnormal   Collection Time: 09/09/18 10:26 PM  Result Value Ref Range   Sodium 139 135 - 145 mmol/L   Potassium 3.1 (L) 3.5 - 5.1 mmol/L   Chloride 102 98 - 111 mmol/L   CO2 27 22 - 32 mmol/L   Glucose, Bld 104 (H) 70 - 99 mg/dL   BUN 23 (H) 6 - 20 mg/dL   Creatinine, Ser 3.76 (H) 0.44 - 1.00 mg/dL   Calcium 8.1 (L) 8.9 - 10.3 mg/dL   Total Protein 6.9 6.5 - 8.1 g/dL   Albumin 3.6 3.5 - 5.0 g/dL   AST 35 15 - 41 U/L   ALT 27 0 - 44  U/L   Alkaline Phosphatase 49 38 - 126 U/L   Total Bilirubin 0.5 0.3 - 1.2 mg/dL   GFR calc non Af Amer 32 (L) >60 mL/min   GFR calc Af Amer 37 (L) >60 mL/min   Anion gap 10 5 - 15    Comment: Performed at [facility], [address] [place], [place] [address]  Lactic acid, plasma     Status: None   Collection Time: 09/09/18 10:26 PM  Result Value Ref Range   Lactic Acid, Venous 0.9 0.5 - 1.9 mmol/L    Comment: Performed at [facility], [address] [place], [place] [address]  Troponin I - Add-On to previous collection     Status: None   Collection Time: 09/09/18 10:26 PM  Result Value Ref Range   Troponin I <0.03 <0.03 ng/mL    Comment: Performed at [facility], [address] [place], [place] [address]  TSH     Status: Abnormal   Collection Time: 09/09/18 10:26 PM  Result Value Ref Range   TSH 100.000 (H) 0.350 - 4.500 uIU/mL    Comment: Performed by a 3rd Generation assay with a functional sensitivity of <=0.01 uIU/mL. Performed at [facility], [address] [place], [place] [address]   Magnesium     Status: None   Collection Time: 09/09/18 10:26 PM  Result Value Ref Range   Magnesium 1.9 1.7 - 2.4 mg/dL    Comment: Performed at [facility], [address] [place], [place] [address]  Phosphorus     Status: None   Collection Time: 09/09/18 10:26 PM  Result Value Ref Range   Phosphorus 3.9 2.5 - 4.6 mg/dL    Comment: Performed at [facility], [address] [place], [place] [address]  Procalcitonin - Baseline     Status: None   Collection Time: 09/09/18 10:26 PM  Result Value Ref Range   Procalcitonin 0.24 ng/mL    Comment:        Interpretation: PCT (Procalcitonin) <= 0.5 ng/mL: Systemic infection (sepsis) is not likely. Local bacterial infection is possible. (NOTE)       Sepsis PCT Algorithm           Lower Respiratory Tract  Infection PCT Algorithm     ----------------------------     ----------------------------         PCT < 0.25 ng/mL                PCT < 0.10 ng/mL         Strongly encourage             Strongly discourage   discontinuation of antibiotics    initiation of antibiotics    ----------------------------     -----------------------------       PCT 0.25 - 0.50 ng/mL            PCT 0.10 - 0.25 ng/mL               OR       >80% decrease in PCT            Discourage initiation of                                            antibiotics      Encourage discontinuation           of antibiotics    ----------------------------     -----------------------------         PCT >= 0.50 ng/mL              PCT 0.26 - 0.50 ng/mL               AND        <80% decrease in PCT             Encourage initiation of                                             antibiotics       Encourage continuation           of antibiotics    ----------------------------     -----------------------------        PCT >= 0.50 ng/mL                  PCT > 0.50 ng/mL               AND         increase in PCT                  Strongly encourage                                      initiation of antibiotics    Strongly encourage escalation           of antibiotics                                     -----------------------------                                           PCT <= 0.25 ng/mL  OR                                        > 80% decrease in PCT                                     Discontinue / Do not initiate                                             antibiotics Performed at [facility], [address] [place], [place] [address]   Blood culture (routine x 2)     Status: None (Preliminary result)   Collection Time: 09/09/18 10:27 PM  Result Value Ref Range   Specimen Description BLOOD RIGHT HAND    Special Requests      BOTTLES DRAWN AEROBIC AND ANAEROBIC Blood Culture results may not be  optimal due to an excessive volume of blood received in culture bottles   Culture      NO GROWTH < 12 HOURS Performed at [facility], [address] [place], [place] [address]    Report Status PENDING   Blood culture (routine x 2)     Status: None (Preliminary result)   Collection Time: 09/09/18 10:27 PM  Result Value Ref Range   Specimen Description BLOOD LEFT ANTECUBITAL    Special Requests      BOTTLES DRAWN AEROBIC AND ANAEROBIC Blood Culture results may not be optimal due to an excessive volume of blood received in culture bottles   Culture      NO GROWTH < 12 HOURS Performed at [facility], [address], [place], [place] [address]    Report Status PENDING   Na and K (sodium & potassium), rand urine     Status: None   Collection Time: 09/10/18 11:01 AM  Result Value Ref Range   Sodium, Ur 55 mmol/L   Potassium Urine 49 mmol/L    Comment: Performed at [facility], [address] [place], [place] [address]  Creatinine, urine, random     Status: None   Collection Time: 09/10/18 11:01 AM  Result Value Ref Range   Creatinine, Urine 265 mg/dL    Comment: Performed at [facility], [address] [place], [place] [address]  Protein, urine, random     Status: None   Collection Time: 09/10/18 11:01 AM  Result Value Ref Range   Total Protein, Urine 72 mg/dL    Comment: NO NORMAL RANGE ESTABLISHED FOR THIS TEST Performed at [facility], [address] [place], [place] [address]    Dg Chest 2 View  Result Date: 09/09/2018 CLINICAL DATA:  Cough, fever, sinus congestion EXAM: CHEST - 2 VIEW COMPARISON:  04/21/2015 FINDINGS: Mild cardiomegaly. Interstitial prominence and patchy airspace disease in the lungs bilaterally concerning for pneumonia. Atypical or mycoplasma pneumonia possible. No effusions or acute bony abnormality. IMPRESSION: Interstitial prominence and patchy bilateral airspace opacities concerning for pneumonia.  Mild cardiomegaly. Electronically Signed   By: Charlett Nose M.D.   On: 09/09/2018 20:03   US Renal  Result Date: 09/10/2018 CLINICAL DATA:  Acute kidney injury. EXAM: RENAL / URINARY TRACT ULTRASOUND COMPLETE COMPARISON:  CT abdomen and pelvis  01/20/2014 FINDINGS: Right Kidney: Renal measurements: 10.5 x 4.5 x 4.4 cm = volume: 107 mL . Echogenicity within normal limits. No mass or hydronephrosis visualized. Left Kidney: Renal measurements: 9.1 x 5.2 x 4.4 cm = volume: 108 mL. Echogenicity within normal limits. No mass or hydronephrosis visualized. Bladder: Appears normal for degree of bladder distention. IMPRESSION: Normal examination. No hydronephrosis in either kidney. Bladder is unremarkable. Electronically Signed   By: Burman NievesWilliam  Stevens M.D.   On: 09/10/2018 02:54    Pending Labs Unresulted Labs (From admission, onward)    Start     Ordered   09/11/18 0500  Procalcitonin  Daily,   STAT    Question:  Specimen collection method  Answer:  Unit=Unit collect   09/09/18 2339   09/10/18 0500  Calcium, ionized  Tomorrow morning,   STAT     09/10/18 0004   09/10/18 0030  Urea nitrogen, urine  Once,   STAT     09/10/18 0029   09/09/18 2353  HIV antibody (Routine Screening)  Once,   STAT     09/09/18 2352   09/09/18 2353  Culture, sputum-assessment  Once,   STAT     09/09/18 2352   09/09/18 2353  Gram stain  Once,   STAT     09/09/18 2352   09/09/18 2353  Strep pneumoniae urinary antigen  Once,   STAT     09/09/18 2352   09/09/18 2353  Legionella Pneumophila Serogp 1 Ur Ag  Once,   STAT     09/09/18 2352   09/09/18 2135  Lactic acid, plasma  Now then every 2 hours,   STAT     09/09/18 2135   Signed and Held  Basic metabolic panel  Tomorrow morning,   R     Signed and Held   Signed and Held  CBC  Tomorrow morning,   R     Signed and Held          Vitals/Pain Today's Vitals   09/10/18 0530 09/10/18 0545 09/10/18 0621 09/10/18 0741  BP:    129/81  Pulse: (!) 55 (!) 51 (!) 51 (!) 50   Resp:    18  Temp:   (!) 97.5 F (36.4 C)   TempSrc:   Oral   SpO2: 98% 98% 97% 98%  Weight:      Height:        Isolation Precautions Droplet precaution  Medications Medications  morphine 2 MG/ML injection 2 mg (has no administration in time range)    Or  morphine 4 MG/ML injection 4 mg (has no administration in time range)  vancomycin (VANCOCIN) IVPB 1000 mg/200 mL premix (has no administration in time range)  lactated ringers infusion ( Intravenous New Bag/Given 09/10/18 1156)  acetaminophen (TYLENOL) tablet 1,000 mg (1,000 mg Oral Given 09/09/18 2224)  ipratropium-albuterol (DUONEB) 0.5-2.5 (3) MG/3ML nebulizer solution 3 mL (3 mLs Nebulization Given 09/09/18 2225)  sodium chloride 0.9 % bolus 1,000 mL (0 mLs Intravenous Stopped 09/09/18 2343)  ketorolac (TORADOL) 30 MG/ML injection 15 mg (15 mg Intravenous Given 09/09/18 2225)  ondansetron (ZOFRAN) injection 4 mg (4 mg Intravenous Given 09/09/18 2225)  cefTRIAXone (ROCEPHIN) 1 g in sodium chloride 0.9 % 100 mL IVPB (0 g Intravenous Stopped 09/09/18 2343)  azithromycin (ZITHROMAX) 500 mg in sodium chloride 0.9 % 250 mL IVPB (0 mg Intravenous Stopped 09/10/18 0213)  oseltamivir (TAMIFLU) capsule 75 mg (75 mg Oral Given 09/09/18 2342)  ipratropium-albuterol (DUONEB) 0.5-2.5 (3) MG/3ML nebulizer solution 3  mL (3 mLs Nebulization Given 09/10/18 0231)  ipratropium-albuterol (DUONEB) 0.5-2.5 (3) MG/3ML nebulizer solution 3 mL (3 mLs Nebulization Given 09/10/18 1020)  potassium chloride SA (K-DUR,KLOR-CON) CR tablet 40 mEq (40 mEq Oral Given 09/10/18 0232)  vancomycin (VANCOCIN) 2,000 mg in sodium chloride 0.9 % 500 mL IVPB (0 mg Intravenous Stopped 09/10/18 0706)    Mobility walks Low fall risk   Focused Assessments Cardiac Assessment Handoff:    Lab Results  Component Value Date   TROPONINI <0.03 09/09/2018   No results found for: DDIMER Does the Patient currently have chest pain? No  , Pulmonary Assessment Handoff:  Lung sounds:  L Breath Sounds: Diminished R Breath Sounds: Diminished O2 Device: Nasal Cannula O2 Flow Rate (L/min): 2 L/min      R Recommendations: See Admitting Provider Note  Report given to:   Additional Notes:

## 2018-09-10 NOTE — Progress Notes (Signed)
Pharmacy Antibiotic Note  Kayla Christensen is a 53 y.o. female admitted on 09/09/2018 with pneumonia.  Pharmacy has been consulted for vanc dosing.  Plan: Patient received vanc 2g IV load in ED  Vancomycin 1000 mg IV Q 24 hrs. Goal AUC 400-550. Expected AUC: 493.3 SCr used: 1.78 mg/dL  Height: 5' 9.45" (859.2 cm) Weight: 259 lb 14.8 oz (117.9 kg) IBW/kg (Calculated) : 63.34  Temp (24hrs), Avg:99.6 F (37.6 C), Min:97.5 F (36.4 C), Max:101.6 F (38.7 C)  Recent Labs  Lab 09/09/18 2226  WBC 3.9*  CREATININE 1.78*  LATICACIDVEN 0.9    Estimated Creatinine Clearance: 48.5 mL/min (A) (by C-G formula based on SCr of 1.78 mg/dL (H)).    Allergies  Allergen Reactions  . Penicillins     Thank you for allowing pharmacy to be a part of this patient's care.  Thomasene Ripple, PharmD, BCPS Clinical Pharmacist 09/10/2018

## 2018-09-11 LAB — CBC
HCT: 34.4 % — ABNORMAL LOW (ref 36.0–46.0)
Hemoglobin: 10.7 g/dL — ABNORMAL LOW (ref 12.0–15.0)
MCH: 26 pg (ref 26.0–34.0)
MCHC: 31.1 g/dL (ref 30.0–36.0)
MCV: 83.7 fL (ref 80.0–100.0)
Platelets: 164 10*3/uL (ref 150–400)
RBC: 4.11 MIL/uL (ref 3.87–5.11)
RDW: 16.2 % — ABNORMAL HIGH (ref 11.5–15.5)
WBC: 3.3 10*3/uL — ABNORMAL LOW (ref 4.0–10.5)
nRBC: 0 % (ref 0.0–0.2)

## 2018-09-11 LAB — BASIC METABOLIC PANEL
Anion gap: 8 (ref 5–15)
BUN: 21 mg/dL — AB (ref 6–20)
CO2: 25 mmol/L (ref 22–32)
Calcium: 8.1 mg/dL — ABNORMAL LOW (ref 8.9–10.3)
Chloride: 102 mmol/L (ref 98–111)
Creatinine, Ser: 1.09 mg/dL — ABNORMAL HIGH (ref 0.44–1.00)
GFR calc Af Amer: >60 mL/min (ref >60)
GFR, EST NON AFRICAN AMERICAN: 58 mL/min — AB (ref >60)
Glucose, Bld: 92 mg/dL (ref 70–99)
Potassium: 2.9 mmol/L — ABNORMAL LOW (ref 3.5–5.1)
SODIUM: 135 mmol/L (ref 135–145)

## 2018-09-11 LAB — PROCALCITONIN: Procalcitonin: <0.1 ng/mL

## 2018-09-11 LAB — MAGNESIUM: MAGNESIUM: 1.8 mg/dL (ref 1.7–2.4)

## 2018-09-11 MED ORDER — HYDRALAZINE HCL 20 MG/ML IJ SOLN: 10.0000 mg | INTRAMUSCULAR | Status: DC | PRN

## 2018-09-11 MED ORDER — CEFDINIR 300 MG PO CAPS
300.0000 mg | ORAL_CAPSULE | Freq: Two times a day (BID) | ORAL | Status: DC
  Administered 2018-09-11 – 2018-09-12 (×3): 300 mg via ORAL
  Filled 2018-09-11 (×4): qty 1

## 2018-09-11 MED ORDER — AMLODIPINE BESYLATE 5 MG PO TABS
5.0000 mg | ORAL_TABLET | Freq: Every day | ORAL | Status: DC
  Administered 2018-09-11 – 2018-09-12 (×2): 5 mg via ORAL
  Filled 2018-09-11 (×2): qty 1

## 2018-09-11 MED ORDER — POTASSIUM CHLORIDE 20 MEQ PO PACK
60.0000 meq | PACK | Freq: Once | ORAL | Status: AC
  Administered 2018-09-11: 60 meq via ORAL
  Filled 2018-09-11: qty 3

## 2018-09-11 MED ORDER — LORATADINE 10 MG PO TABS
10.0000 mg | ORAL_TABLET | Freq: Every day | ORAL | Status: DC
  Administered 2018-09-11 – 2018-09-12 (×2): 10 mg via ORAL
  Filled 2018-09-11 (×2): qty 1

## 2018-09-11 MED ORDER — SODIUM CHLORIDE 0.9 % IV SOLN
INTRAVENOUS | Status: DC | PRN
  Administered 2018-09-11: 03:00:00 via INTRAVENOUS

## 2018-09-11 MED ORDER — AZITHROMYCIN 250 MG PO TABS
250.0000 mg | ORAL_TABLET | Freq: Every day | ORAL | Status: DC
  Administered 2018-09-12: 250 mg via ORAL
  Filled 2018-09-11: qty 1

## 2018-09-11 MED ORDER — LEVOTHYROXINE SODIUM 100 MCG PO TABS
100.0000 ug | ORAL_TABLET | ORAL | Status: DC
  Administered 2018-09-12: 100 ug via ORAL
  Filled 2018-09-11: qty 1

## 2018-09-11 MED ORDER — AZITHROMYCIN 250 MG PO TABS
500.0000 mg | ORAL_TABLET | Freq: Every day | ORAL | Status: DC
  Administered 2018-09-11: 500 mg via ORAL
  Filled 2018-09-11: qty 2

## 2018-09-11 NOTE — Progress Notes (Signed)
PHARMACIST - PHYSICIAN COMMUNICATION  CONCERNING: IV to Oral Route Change Policy  RECOMMENDATION: This patient is receiving azithromycin by the intravenous route.  Based on criteria approved by the Pharmacy and Therapeutics Committee, the intravenous medication(s) is/are being converted to the equivalent oral dose form(s).   DESCRIPTION: These criteria include:  The patient is eating (either orally or via tube) and/or has been taking other orally administered medications for a least 24 hours  The patient has no evidence of active gastrointestinal bleeding or impaired GI absorption (gastrectomy, short bowel, patient on TNA or NPO).  If you have questions about this conversion, please contact the Pharmacy Department   []   [phone] )  [facility]  [place], [facility] 09/11/2018 9:56 AM

## 2018-09-11 NOTE — Plan of Care (Signed)
  Problem: Clinical Measurements: Goal: Ability to maintain a body temperature in the normal range will improve Outcome: Progressing   Problem: Respiratory: Goal: Ability to maintain a clear airway will improve Outcome: Progressing   Problem: Safety: Goal: Ability to remain free from injury will improve Outcome: Progressing

## 2018-09-11 NOTE — Progress Notes (Signed)
Family Meeting Note  Advance Directive:yes  Today a meeting took place with the Patient.  Patient is able to participate   The following clinical team members were present during this meeting:MD  The following were discussed:Patient's diagnosis: Acute influenza infection, bilateral pneumonia, hypertension, Patient's progosis: Unable to determine and Goals for treatment: Full Code  Additional follow-up to be provided: prn  Time spent during discussion:20 minutes  Bertrum Sol, MD

## 2018-09-11 NOTE — Progress Notes (Addendum)
[facility] [facility] [place] [facility]   PATIENT NAME: Rhesa Pirraglia    MR#:  [id number]  DATE OF BIRTH:  1966/06/26  SUBJECTIVE:  CHIEF COMPLAINT:   Patient feels slightly better, complains of headache, generalized weakness/fatigue, not back to herself, TSH noted-Synthroid adjusted 200 mcg daily, change antibiotics to p.o., replete potassium, start Norvasc, increase activity, decrease IV fluids  REVIEW OF SYSTEMS:  CONSTITUTIONAL: No fever, fatigue or weakness.  EYES: No blurred or double vision.  EARS, NOSE, AND THROAT: No tinnitus or ear pain.  RESPIRATORY: No cough, shortness of breath, wheezing or hemoptysis.  CARDIOVASCULAR: No chest pain, orthopnea, edema.  GASTROINTESTINAL: No nausea, vomiting, diarrhea or abdominal pain.  GENITOURINARY: No dysuria, hematuria.  ENDOCRINE: No polyuria, nocturia,  HEMATOLOGY: No anemia, easy bruising or bleeding SKIN: No rash or lesion. MUSCULOSKELETAL: No joint pain or arthritis.   NEUROLOGIC: No tingling, numbness, weakness.  PSYCHIATRY: No anxiety or depression.   ROS  DRUG ALLERGIES:   Allergies  Allergen Reactions  . Penicillins     VITALS:  Blood pressure (!) 168/88, pulse (!) 51, temperature 98.4 F (36.9 C), temperature source Oral, resp. rate (!) 29, height 5\' 10"  (1.778 m), weight 105.1 kg, SpO2 93 %.  PHYSICAL EXAMINATION:  GENERAL:  53 y.o.-year-old patient lying in the bed with no acute distress.  EYES: Pupils equal, round, reactive to light and accommodation. No scleral icterus. Extraocular muscles intact.  HEENT: Head atraumatic, normocephalic. Oropharynx and nasopharynx clear.  NECK:  Supple, no jugular venous distention. No thyroid enlargement, no tenderness.  LUNGS: Normal breath sounds bilaterally, no wheezing, rales,rhonchi or crepitation. No use of accessory muscles of respiration.  CARDIOVASCULAR: S1, S2 normal. No murmurs, rubs, or gallops.  ABDOMEN: Soft, nontender, nondistended. Bowel  sounds present. No organomegaly or mass.  EXTREMITIES: No pedal edema, cyanosis, or clubbing.  NEUROLOGIC: Cranial nerves II through XII are intact. Muscle strength 5/5 in all extremities. Sensation intact. Gait not checked.  PSYCHIATRIC: The patient is alert and oriented x 3.  SKIN: No obvious rash, lesion, or ulcer.   Physical Exam LABORATORY PANEL:   CBC Recent Labs  Lab 09/11/18 0356  WBC 3.3*  HGB 10.7*  HCT 34.4*  PLT 164   ------------------------------------------------------------------------------------------------------------------  Chemistries  Recent Labs  Lab 09/09/18 2226 09/11/18 0356 09/11/18 0833  NA 139 135  --   K 3.1* 2.9*  --   CL 102 102  --   CO2 27 25  --   GLUCOSE 104* 92  --   BUN 23* 21*  --   CREATININE 1.78* 1.09*  --   CALCIUM 8.1* 8.1*  --   MG 1.9  --  1.8  AST 35  --   --   ALT 27  --   --   ALKPHOS 49  --   --   BILITOT 0.5  --   --    ------------------------------------------------------------------------------------------------------------------  Cardiac Enzymes Recent Labs  Lab 09/09/18 2226  TROPONINI <0.03   ------------------------------------------------------------------------------------------------------------------  RADIOLOGY:  Dg Chest 2 View  Result Date: 09/09/2018 CLINICAL DATA:  Cough, fever, sinus congestion EXAM: CHEST - 2 VIEW COMPARISON:  04/21/2015 FINDINGS: Mild cardiomegaly. Interstitial prominence and patchy airspace disease in the lungs bilaterally concerning for pneumonia. Atypical or mycoplasma pneumonia possible. No effusions or acute bony abnormality. IMPRESSION: Interstitial prominence and patchy bilateral airspace opacities concerning for pneumonia. Mild cardiomegaly. Electronically Signed   By: Charlett Nose M.D.   On: 09/09/2018 20:03   US Renal  Result Date: 09/10/2018 CLINICAL DATA:  Acute kidney injury. EXAM: RENAL / URINARY TRACT ULTRASOUND COMPLETE COMPARISON:  CT abdomen and pelvis  01/20/2014 FINDINGS: Right Kidney: Renal measurements: 10.5 x 4.5 x 4.4 cm = volume: 107 mL . Echogenicity within normal limits. No mass or hydronephrosis visualized. Left Kidney: Renal measurements: 9.1 x 5.2 x 4.4 cm = volume: 108 mL. Echogenicity within normal limits. No mass or hydronephrosis visualized. Bladder: Appears normal for degree of bladder distention. IMPRESSION: Normal examination. No hydronephrosis in either kidney. Bladder is unremarkable. Electronically Signed   By: Burman Nieves M.D.   On: 09/10/2018 02:54    ASSESSMENT AND PLAN:  *Acute sepsis secondary to bilateral pneumonia and influenza A infection Continue sepsis protocol, empiric Rocephin/azithromycin/vancomycin, Tamiflu, IV fluids for rehydration, follow-up on cultures  *Acute bilateral pneumonia Resolving Most likely secondary to acute influenza infection Change antibiotics to p.o.-cefdinir/azithromycin, discontinue vancomycin, follow-up on cultures   *Acute influenza A infection Resolving Continue Tamiflu for 5-day course with supportive care  *Chronic hypothyroidism, unspecified TSH greater than 100,000 Synthroid decreased to 100 mcg daily - will need to have TSH rechecked in 4 to 6 weeks for reevaluation  *Chronic obesity Most likely secondary to excess calories Lifestyle modification recommended  *Hypertension Start Norvasc, IV hydralazine as needed systolic blood pressure greater than 160, vitals per routine, make changes as per necessary  Disposition to home in 1-2 days pending clinical course  All the records are reviewed and case discussed with Care Management/Social Workerr. Management plans discussed with the patient, family and they are in agreement.  CODE STATUS: full  TOTAL TIME TAKING CARE OF THIS PATIENT: 35 minutes.   POSSIBLE D/C IN 1-2 DAYS, DEPENDING ON CLINICAL CONDITION.   Evelena Asa Shakia Sebastiano M.D on 09/11/2018   Between 7am to 6pm - Pager - [phone]  After 6pm go to  [email or web address] - password [email or web address]  [facility] [place] [facility]  Office  [phone]  CC: Primary care physician; Margorie John, MD  Note: This dictation was prepared with Dragon dictation along with smaller phrase technology. Any transcriptional errors that result from this process are unintentional.

## 2018-09-12 LAB — PROCALCITONIN: Procalcitonin: <0.1 ng/mL

## 2018-09-12 LAB — LEGIONELLA PNEUMOPHILA SEROGP 1 UR AG: L. pneumophila Serogp 1 Ur Ag: NEGATIVE

## 2018-09-12 LAB — POTASSIUM: Potassium: 3.1 mmol/L — ABNORMAL LOW (ref 3.5–5.1)

## 2018-09-12 LAB — UREA NITROGEN, URINE: UREA NITROGEN UR: 703 mg/dL

## 2018-09-12 MED ORDER — AMLODIPINE BESYLATE 5 MG PO TABS: 5.0000 mg | ORAL_TABLET | Freq: Every day | ORAL | 0 refills | Status: DC

## 2018-09-12 MED ORDER — POTASSIUM CHLORIDE 20 MEQ PO PACK: 20.0000 meq | PACK | Freq: Every day | ORAL | 0 refills | Status: DC

## 2018-09-12 MED ORDER — CEFDINIR 300 MG PO CAPS: 300.0000 mg | ORAL_CAPSULE | Freq: Two times a day (BID) | ORAL | 0 refills | Status: DC

## 2018-09-12 MED ORDER — POTASSIUM CHLORIDE 20 MEQ PO PACK
40.0000 meq | PACK | Freq: Once | ORAL | Status: AC
  Administered 2018-09-12: 40 meq via ORAL
  Filled 2018-09-12: qty 2

## 2018-09-12 MED ORDER — LEVOTHYROXINE SODIUM 100 MCG PO TABS: 100.0000 ug | ORAL_TABLET | ORAL | 0 refills | Status: AC

## 2018-09-12 MED ORDER — OSELTAMIVIR PHOSPHATE 75 MG PO CAPS: 75.0000 mg | ORAL_CAPSULE | Freq: Two times a day (BID) | ORAL | 0 refills | Status: DC

## 2018-09-12 MED ORDER — LORATADINE 10 MG PO TABS: 10.0000 mg | ORAL_TABLET | Freq: Every day | ORAL | 2 refills | Status: DC

## 2018-09-12 MED ORDER — AZITHROMYCIN 250 MG PO TABS: ORAL_TABLET | ORAL | 0 refills | Status: DC

## 2018-09-12 NOTE — Plan of Care (Signed)
No complaints of pain. Patient denies shortness of breath, but some intermittent wheezing.

## 2018-09-12 NOTE — Discharge Summary (Signed)
[facility] -  at [facility]   PATIENT NAME: Kayla Christensen    MR#:  [id number]  DATE OF BIRTH:  03-Sep-1965  DATE OF ADMISSION:  09/09/2018 ADMITTING PHYSICIAN: Barbaraann Rondo, MD  DATE OF DISCHARGE: No discharge date for patient encounter.  PRIMARY CARE PHYSICIAN: Margorie John, MD    ADMISSION DIAGNOSIS:  Influenza A [J10.1] Hypoxia [R09.02] AKI (acute kidney injury) (HCC) [N17.9] Community acquired pneumonia, unspecified laterality [J18.9]  DISCHARGE DIAGNOSIS:  Active Problems:   Sepsis due to pneumonia (HCC)   SECONDARY DIAGNOSIS:   Past Medical History:  Diagnosis Date  . Hypertension     HOSPITAL COURSE:  *Acute sepsis secondary to bilateral pneumonia and influenza A infection Resolved Treated on our sepsis protocol, provided empiric Rocephin/azithromycin/vancomycin while in house, Tamiflu, IV fluids for rehydration, and patient did well  *Acute bilateral pneumonia Resolving Most likely secondary to acute influenza infection Treated initially with antibiotics per above-changed to p.o.-cefdinir/azithromycin, discontinued vancomycin, treated on our pneumonia protocol, and patient did well  *Acute influenza A infection Resolving Treated with Tamiflu while in house with supportive care  *Chronic hypothyroidism, unspecified TSH greater than 100,000 Synthroid decreased to 100 mcg daily - will need to have TSH rechecked in 4 to 6 weeks for reevaluation-we will arrange for outpatient follow-up with primary care provider in 3 to 5 days for reevaluation  *Chronic obesity Most likely secondary to excess calories Lifestyle modification recommended  *Hypertension Stable on current regiment, Norvasc added to home regiment   DISCHARGE CONDITIONS:   stable  CONSULTS OBTAINED:  Treatment Team:  Barbaraann Rondo, MD  DRUG ALLERGIES:   Allergies  Allergen Reactions  . Penicillins     DISCHARGE MEDICATIONS:    Allergies as of 09/12/2018      Reactions   Penicillins       Medication List    TAKE these medications   amLODipine 5 MG tablet Commonly known as:  NORVASC Take 1 tablet (5 mg total) by mouth daily.   atorvastatin 40 MG tablet Commonly known as:  LIPITOR Take 40 mg by mouth daily.   azithromycin 250 MG tablet Commonly known as:  ZITHROMAX 1 p.o. daily   carvedilol 25 MG tablet Commonly known as:  COREG Take 25 mg by mouth 2 (two) times daily with a meal.   cefdinir 300 MG capsule Commonly known as:  OMNICEF Take 1 capsule (300 mg total) by mouth every 12 (twelve) hours.   levothyroxine 100 MCG tablet Commonly known as:  SYNTHROID, LEVOTHROID Take 1 tablet (100 mcg total) by mouth every morning. Start taking on:  September 13, 2018 What changed:    medication strength  how much to take   lisinopril-hydrochlorothiazide 20-12.5 MG tablet Commonly known as:  PRINZIDE,ZESTORETIC Take 1 tablet by mouth daily.   loratadine 10 MG tablet Commonly known as:  CLARITIN Take 1 tablet (10 mg total) by mouth daily.   multivitamin capsule Take 1 capsule by mouth daily.   nabumetone 500 MG tablet Commonly known as:  RELAFEN Take 500 mg by mouth 2 (two) times daily.   oseltamivir 75 MG capsule Commonly known as:  TAMIFLU Take 1 capsule (75 mg total) by mouth 2 (two) times daily.   potassium chloride 20 MEQ packet Commonly known as:  KLOR-CON Take 20 mEq by mouth daily.        DISCHARGE INSTRUCTIONS:  If you experience worsening of your admission symptoms, develop shortness of breath, life threatening emergency, suicidal or homicidal thoughts you must  seek medical attention immediately by calling 911 or calling your MD immediately  if symptoms less severe.  You Must read complete instructions/literature along with all the possible adverse reactions/side effects for all the Medicines you take and that have been prescribed to you. Take any new Medicines after you have  completely understood and accept all the possible adverse reactions/side effects.   Please note  You were cared for by a hospitalist during your hospital stay. If you have any questions about your discharge medications or the care you received while you were in the hospital after you are discharged, you can call the unit and asked to speak with the hospitalist on call if the hospitalist that took care of you is not available. Once you are discharged, your primary care physician will handle any further medical issues. Please note that NO REFILLS for any discharge medications will be authorized once you are discharged, as it is imperative that you return to your primary care physician (or establish a relationship with a primary care physician if you do not have one) for your aftercare needs so that they can reassess your need for medications and monitor your lab values.    Today   CHIEF COMPLAINT:   Chief Complaint  Patient presents with  . Fever  . Cough    HISTORY OF PRESENT ILLNESS:  53 y.o. female with a known history of obesity, HTN, HLD, hypothyroidism p/w pneumonia. Endorses onset of symptoms Tues (2/25), URI + sinusitis symptoms (facial pain, sinus congestion, rhinorrhea, cough productive of yellow/brown sputum, post-nasal drip). Took some OTC medications w/ little improvement, but states she took a second OTC remedy that helped. Fri (2/28) however, pt endorses profound generalized weakness and mid-lower back pain along the midline. Endorses fever + chills, but denies rigors. Endorses diaphoresis, but denies night sweats. Endorses headache and pain in the B/L ears. Lung sounds diminished, however I did not appreciate any adventitious lung sounds. Non-smoker. Works at a nursing home. Flu A (+). CXR (+) "Interstitial prominence and patchy bilateral airspace opacities concerning for pneumonia." Febrile, hypoxic (88% on RA, improved to 94-96% on 2L [place]), WBC < 4.0, SIRS (+), (+) sepsis.  VITAL  SIGNS:  Blood pressure (!) 154/95, pulse (!) 58, temperature 98.8 F (37.1 C), temperature source Oral, resp. rate 16, height 5\' 10"  (1.778 m), weight 105.1 kg, SpO2 93 %.  I/O:    Intake/Output Summary (Last 24 hours) at 09/12/2018 1022 Last data filed at 09/12/2018 0500 Gross per 24 hour  Intake 407.82 ml  Output 900 ml  Net -492.18 ml    PHYSICAL EXAMINATION:  GENERAL:  53 y.o.-year-old patient lying in the bed with no acute distress.  EYES: Pupils equal, round, reactive to light and accommodation. No scleral icterus. Extraocular muscles intact.  HEENT: Head atraumatic, normocephalic. Oropharynx and nasopharynx clear.  NECK:  Supple, no jugular venous distention. No thyroid enlargement, no tenderness.  LUNGS: Normal breath sounds bilaterally, no wheezing, rales,rhonchi or crepitation. No use of accessory muscles of respiration.  CARDIOVASCULAR: S1, S2 normal. No murmurs, rubs, or gallops.  ABDOMEN: Soft, non-tender, non-distended. Bowel sounds present. No organomegaly or mass.  EXTREMITIES: No pedal edema, cyanosis, or clubbing.  NEUROLOGIC: Cranial nerves II through XII are intact. Muscle strength 5/5 in all extremities. Sensation intact. Gait not checked.  PSYCHIATRIC: The patient is alert and oriented x 3.  SKIN: No obvious rash, lesion, or ulcer.   DATA REVIEW:   CBC Recent Labs  Lab 09/11/18 0356  WBC 3.3*  HGB 10.7*  HCT 34.4*  PLT 164    Chemistries  Recent Labs  Lab 09/09/18 2226 09/11/18 0356 09/11/18 0833 09/12/18 0520  NA 139 135  --   --   K 3.1* 2.9*  --  3.1*  CL 102 102  --   --   CO2 27 25  --   --   GLUCOSE 104* 92  --   --   BUN 23* 21*  --   --   CREATININE 1.78* 1.09*  --   --   CALCIUM 8.1* 8.1*  --   --   MG 1.9  --  1.8  --   AST 35  --   --   --   ALT 27  --   --   --   ALKPHOS 49  --   --   --   BILITOT 0.5  --   --   --     Cardiac Enzymes Recent Labs  Lab 09/09/18 2226  TROPONINI <0.03    Microbiology Results  Results for  orders placed or performed during the hospital encounter of 09/09/18  Blood culture (routine x 2)     Status: None (Preliminary result)   Collection Time: 09/09/18 10:27 PM  Result Value Ref Range Status   Specimen Description BLOOD RIGHT HAND  Final   Special Requests   Final    BOTTLES DRAWN AEROBIC AND ANAEROBIC Blood Culture results may not be optimal due to an excessive volume of blood received in culture bottles   Culture   Final    NO GROWTH 2 DAYS Performed at [facility], [address], [place], [place] [address]    Report Status PENDING  Incomplete  Blood culture (routine x 2)     Status: None (Preliminary result)   Collection Time: 09/09/18 10:27 PM  Result Value Ref Range Status   Specimen Description BLOOD LEFT ANTECUBITAL  Final   Special Requests   Final    BOTTLES DRAWN AEROBIC AND ANAEROBIC Blood Culture results may not be optimal due to an excessive volume of blood received in culture bottles   Culture   Final    NO GROWTH 2 DAYS Performed at [facility], [address], [place], [place] [address]    Report Status PENDING  Incomplete    RADIOLOGY:  No results found.  EKG:   Orders placed or performed during the hospital encounter of 04/21/15  . EKG 12-Lead  . EKG 12-Lead  . EKG      Management plans discussed with the patient, family and they are in agreement.  CODE STATUS:     Code Status Orders  (From admission, onward)         Start     Ordered   09/10/18 1344  Full code  Continuous     09/10/18 1343        Code Status History    This patient has a current code status but no historical code status.      TOTAL TIME TAKING CARE OF THIS PATIENT: 40 minutes.    Evelena Asa Pailynn Vahey M.D on 09/12/2018 at 10:22 AM  Between 7am to 6pm - Pager - [phone]  After 6pm go to [email or web address] - password [email or web address]  [facility]  Office  [phone]  CC: Primary care physician; Margorie John,  MD   Note: This dictation was prepared with Dragon dictation along with smaller phrase technology. Any transcriptional errors that result from this  process are unintentional.

## 2018-09-12 NOTE — Progress Notes (Signed)
Pt to be discharged this afternoon. Iv and tele removed. Pt ambulated in hallway. tol well. disch instructions and prescrips and work excuse given to her. Awaiting transport.

## 2018-09-13 LAB — HIV ANTIBODY (ROUTINE TESTING W REFLEX): HIV SCREEN 4TH GENERATION: NONREACTIVE

## 2018-09-13 LAB — CALCIUM, IONIZED: Calcium, Ionized, Serum: 4.6 mg/dL (ref 4.5–5.6)

## 2018-09-14 LAB — CULTURE, BLOOD (ROUTINE X 2)
Culture: NO GROWTH
Culture: NO GROWTH

## 2019-03-01 ENCOUNTER — Other Ambulatory Visit: Payer: Self-pay | Admitting: Orthopedic Surgery

## 2019-03-01 DIAGNOSIS — M1712 Unilateral primary osteoarthritis, left knee: Secondary | ICD-10-CM

## 2019-03-07 ENCOUNTER — Ambulatory Visit
Admission: RE | Admit: 2019-03-07 | Discharge: 2019-03-07 | Disposition: A | Discharge status: Home / Self Care | Payer: No Typology Code available for payment source | Source: Ambulatory Visit | Attending: Orthopedic Surgery | Admitting: Orthopedic Surgery

## 2019-03-07 ENCOUNTER — Other Ambulatory Visit: Payer: Self-pay

## 2019-03-07 DIAGNOSIS — M1712 Unilateral primary osteoarthritis, left knee: Secondary | ICD-10-CM | POA: Diagnosis present

## 2019-03-15 ENCOUNTER — Emergency Department
Admission: EM | Admit: 2019-03-15 | Discharge: 2019-03-15 | Disposition: A | Discharge status: Home / Self Care | Payer: No Typology Code available for payment source | Attending: Emergency Medicine | Admitting: Emergency Medicine

## 2019-03-15 ENCOUNTER — Encounter: Payer: Self-pay | Admitting: Emergency Medicine

## 2019-03-15 ENCOUNTER — Other Ambulatory Visit: Payer: Self-pay

## 2019-03-15 DIAGNOSIS — Z79899 Other long term (current) drug therapy: Secondary | ICD-10-CM | POA: Diagnosis not present

## 2019-03-15 DIAGNOSIS — R030 Elevated blood-pressure reading, without diagnosis of hypertension: Secondary | ICD-10-CM | POA: Diagnosis present

## 2019-03-15 DIAGNOSIS — I10 Essential (primary) hypertension: Secondary | ICD-10-CM | POA: Insufficient documentation

## 2019-03-15 DIAGNOSIS — T783XXA Angioneurotic edema, initial encounter: Secondary | ICD-10-CM | POA: Diagnosis not present

## 2019-03-15 MED ORDER — DEXAMETHASONE SODIUM PHOSPHATE 10 MG/ML IJ SOLN
10.0000 mg | Freq: Once | INTRAMUSCULAR | Status: AC
  Administered 2019-03-15: 10 mg via INTRAMUSCULAR
  Filled 2019-03-15: qty 1

## 2019-03-15 MED ORDER — PREDNISONE 20 MG PO TABS: 20.0000 mg | ORAL_TABLET | Freq: Two times a day (BID) | ORAL | 0 refills | Status: AC

## 2019-03-15 MED ORDER — ACETAMINOPHEN 500 MG PO TABS
1000.0000 mg | ORAL_TABLET | Freq: Once | ORAL | Status: AC
  Administered 2019-03-15: 15:00:00 1000 mg via ORAL

## 2019-03-15 MED ORDER — ACETAMINOPHEN 500 MG PO TABS
ORAL_TABLET | ORAL | Status: AC
  Filled 2019-03-15: qty 2

## 2019-03-15 MED ORDER — FAMOTIDINE 20 MG PO TABS
40.0000 mg | ORAL_TABLET | Freq: Once | ORAL | Status: AC
  Administered 2019-03-15: 40 mg via ORAL
  Filled 2019-03-15: qty 2

## 2019-03-15 MED ORDER — FAMOTIDINE 20 MG PO TABS: 20.0000 mg | ORAL_TABLET | Freq: Two times a day (BID) | ORAL | 0 refills | Status: DC

## 2019-03-15 NOTE — Discharge Instructions (Addendum)
Your exam is essentially normal today.  You have symptoms that are consistent with a probable angioedema. This is any sudden swelling of the face, lips, throat; and can be associated with your class of blood pressure medicine. You do not have any signs of a serious anaphylaxis or life-threatening swelling. Take the prescription meds as directed. Follow-up with your PCP as planned. Return to the ED immediately for worsening symptoms.

## 2019-03-15 NOTE — ED Notes (Signed)
Spoke with Dr Archie Balboa about further orders. No labs needed. Do not treat BP at this time only headache. New order received for tylenol. Will recheck in an hour.

## 2019-03-15 NOTE — ED Triage Notes (Signed)
Patient presents to ED via POV with c/o hypertension. Patient reports highest reading at home was 209/120. History of hypertension. Patient reports missing her lisnopril dose this morning. Patient states "I think I am allergic to it. My hands are swollen and they normally don't". Denies changes in vision. Endorses headache.

## 2019-03-15 NOTE — ED Notes (Signed)
Pt reports elevated blood pressure.  Pt did not take lisinipril this am.  Pt took other blood pressure med last night.  No headache.  Pt states I just feel tired and sleepy.  Pt sleepy now.  Answers questions appr.  Speech clear.

## 2019-03-15 NOTE — ED Provider Notes (Signed)
[name] Emergency Department Provider Note ____________________________________________  Time seen: 1630  I have reviewed the triage vital signs and the nursing notes.  HISTORY  Chief Complaint  Hypertension  HPI Kayla Christensen is a 53 y.o. female presents with self to the ED for evaluation of  elevated blood pressure.  Patient also notes that on Sunday she experienced fullness and swelling in her face and throat.  She reports that she also had some right otalgia as well as facial pressure.  She denies any fever, chills, sweats patient also denies any nausea, vomiting, dizziness.  She did not experience any chest pain, shortness of breath or syncope.  At one point she felt as though her tongue was full, but denied any airway compromise.  She took Benadryl with some benefit of her facial swelling.  She did online surgery for symptoms, and discontinued her lisinopril at that time.  She had been also taking terbinafine simultaneously for a cutaneous fungal infection.  She continue with the terbinafine and denies any symptoms.  She presents today from work, after she noted her blood pressure was elevated at 209/120. She is here at the advice of her supervisor, for evaluation. Overall, she reports she is relieved by her lower BP readings. She does note some mild throat irritation.   Past Medical History:  Diagnosis Date  . Hypertension     Patient Active Problem List   Diagnosis Date Noted  . Sepsis due to pneumonia ([place]) 09/09/2018    Past Surgical History:  Procedure Laterality Date  . THYROIDECTOMY    . TUBAL LIGATION      Prior to Admission medications   Medication Sig Start Date End Date Taking? Authorizing Provider  atorvastatin (LIPITOR) 40 MG tablet Take 40 mg by mouth daily. 03/22/18   [provider]  carvedilol (COREG) 25 MG tablet Take 25 mg by mouth 2 (two) times daily with a meal. 03/22/18   [provider]  famotidine  (PEPCID) 20 MG tablet Take 1 tablet (20 mg total) by mouth 2 (two) times daily for 10 days. 03/15/19 03/25/19  Marty Sadlowski, Dannielle Karvonen, PA-C  levothyroxine (SYNTHROID, LEVOTHROID) 100 MCG tablet Take 1 tablet (100 mcg total) by mouth every morning. 09/13/18   Salary, Avel Peace, MD  lisinopril-hydrochlorothiazide (PRINZIDE,ZESTORETIC) 20-12.5 MG tablet Take 1 tablet by mouth daily. 03/22/18   [provider]  loratadine (CLARITIN) 10 MG tablet Take 1 tablet (10 mg total) by mouth daily. 09/12/18   Salary, Avel Peace, MD  Multiple Vitamin (MULTIVITAMIN) capsule Take 1 capsule by mouth daily. 12/18/10   [provider]  predniSONE (DELTASONE) 20 MG tablet Take 1 tablet (20 mg total) by mouth 2 (two) times daily with a meal for 5 days. 03/15/19 03/20/19  Aimi Essner, Dannielle Karvonen, PA-C  amLODipine (NORVASC) 5 MG tablet Take 1 tablet (5 mg total) by mouth daily. 09/12/18 03/15/19  Salary, Avel Peace, MD  potassium chloride (KLOR-CON) 20 MEQ packet Take 20 mEq by mouth daily. 09/12/18 03/15/19  Salary, Avel Peace, MD    Allergies Penicillins  No family history on file.  Social History Social History   Tobacco Use  . Smoking status: Never Smoker  . Smokeless tobacco: Never Used  Substance Use Topics  . Alcohol use: No  . Drug use: No    Review of Systems  Constitutional: Negative for fever. Eyes: Negative for visual changes. ENT: Negative for sore throat. History of facial swelling.  Cardiovascular: Negative for chest pain.  Elevated BP Respiratory: Negative for shortness of breath. Gastrointestinal: Negative for abdominal pain, vomiting and diarrhea. Genitourinary: Negative for dysuria. Musculoskeletal: Negative for back pain. Skin: Negative for rash. Neurological: Negative for headaches, focal weakness or numbness. ____________________________________________  PHYSICAL EXAM:  VITAL SIGNS: ED Triage Vitals  Enc Vitals Group     BP 03/15/19 1506 (!) 165/94     Pulse Rate 03/15/19 1506 61      Resp 03/15/19 1506 20     Temp 03/15/19 1506 98.9 F (37.2 C)     Temp Source 03/15/19 1506 Oral     SpO2 03/15/19 1506 97 %     Weight 03/15/19 1507 260 lb (117.9 kg)     Height 03/15/19 1507 5\' 9"  (1.753 m)     Head Circumference --      Peak Flow --      Pain Score 03/15/19 1506 9     Pain Loc --      Pain Edu? --      Excl. in GC? --    Constitutional: Alert and oriented. Well appearing and in no distress. GCS=15 Head: Normocephalic and atraumatic. Eyes: Conjunctivae are normal. PERRL. Normal extraocular movements. No periorbital edema noted Ears: Canals clear. TMs intact bilaterally. Nose: No congestion/rhinorrhea/epistaxis. Mouth/Throat: Mucous membranes are moist. Uvula is midline and tonsils are flat. No oral lesions noted. No perioral edema or angioedema noted.  Neck: Supple. No thyromegaly. Hematological/Lymphatic/Immunological: No cervical lymphadenopathy. Cardiovascular: Normal rate, regular rhythm. Normal distal pulses. Respiratory: Normal respiratory effort. No wheezes/rales/rhonchi. Gastrointestinal: Soft and nontender. No distention. Musculoskeletal: Nontender with normal range of motion in all extremities.  Neurologic:  Normal gait without ataxia. Normal speech and language. No gross focal neurologic deficits are appreciated. Skin:  Skin is warm, dry and intact. No rash noted. Psychiatric: Mood and affect are normal. Patient exhibits appropriate insight and judgment. ____________________________________________  PROCEDURES  Decadron 10 mg IM Famotidine 40 mg PO Tylenol 1000 mg PO Procedures ____________________________________________  INITIAL IMPRESSION / ASSESSMENT AND PLAN / ED COURSE  Patient with ED evaluation of suspected angioedema, now resolved.  Patient noted onset on Sunday of some swelling to the mid face as well as the lip.  She took Benadryl in the interim and presents today with symptoms resolved.  She has had continued elevated blood  pressure readings after discontinuing her ACE inhibitor.  She presents now for work for evaluation of elevated blood pressure.  Her exam is overall benign and reassuring at this time.  Blood pressures have normalized in the ED without intervention.  Patient is clinically stable without signs of acute anaphylaxis, angioedema, or acute respiratory distress.  She will be treated empirically with histamine blockade as well as steroid.  She be discharged with a prescription for famotidine and prednisone to take as directed.  She will follow-up with her primary provider via a telemetry visit tomorrow, as scheduled.  Return precautions have been reviewed.  Oluwatomi Scheffler was evaluated in Emergency Department on 03/15/2019 for the symptoms described in the history of present illness. She was evaluated in the context of the global COVID-19 pandemic, which necessitated consideration that the patient might be at risk for infection with the SARS-CoV-2 virus that causes COVID-19. Institutional protocols and algorithms that pertain to the evaluation of patients at risk for COVID-19 are in a state of rapid change based on information released by regulatory bodies including the CDC and federal and state organizations. These policies and algorithms were followed during the patient's care in  the ED. ____________________________________________  FINAL CLINICAL IMPRESSION(S) / ED DIAGNOSES  Final diagnoses:  Angioedema, initial encounter  Essential hypertension      Karmen StabsMenshew, Charlesetta IvoryJenise V Bacon, PA-C 03/15/19 1802    Jene EveryKinner, Robert, MD 03/15/19 1910

## 2019-03-25 ENCOUNTER — Other Ambulatory Visit: Payer: Self-pay

## 2019-03-25 ENCOUNTER — Encounter: Payer: Self-pay | Admitting: Emergency Medicine

## 2019-03-25 ENCOUNTER — Emergency Department
Admission: EM | Admit: 2019-03-25 | Discharge: 2019-03-26 | Disposition: A | Discharge status: Home / Self Care | Payer: No Typology Code available for payment source | Attending: Emergency Medicine | Admitting: Emergency Medicine

## 2019-03-25 DIAGNOSIS — R350 Frequency of micturition: Secondary | ICD-10-CM | POA: Diagnosis not present

## 2019-03-25 DIAGNOSIS — I10 Essential (primary) hypertension: Secondary | ICD-10-CM | POA: Diagnosis not present

## 2019-03-25 DIAGNOSIS — R109 Unspecified abdominal pain: Secondary | ICD-10-CM | POA: Insufficient documentation

## 2019-03-25 DIAGNOSIS — R110 Nausea: Secondary | ICD-10-CM | POA: Diagnosis not present

## 2019-03-25 DIAGNOSIS — Z79899 Other long term (current) drug therapy: Secondary | ICD-10-CM | POA: Diagnosis not present

## 2019-03-25 HISTORY — DX: Personal history of urinary calculi: Z87.442

## 2019-03-25 HISTORY — DX: Morbid (severe) obesity due to excess calories: E66.01

## 2019-03-25 LAB — CBC
HCT: 36.5 % (ref 36.0–46.0)
Hemoglobin: 11.8 g/dL — ABNORMAL LOW (ref 12.0–15.0)
MCH: 26.9 pg (ref 26.0–34.0)
MCHC: 32.3 g/dL (ref 30.0–36.0)
MCV: 83.1 fL (ref 80.0–100.0)
Platelets: 219 10*3/uL (ref 150–400)
RBC: 4.39 MIL/uL (ref 3.87–5.11)
RDW: 16.4 % — ABNORMAL HIGH (ref 11.5–15.5)
WBC: 5.6 10*3/uL (ref 4.0–10.5)
nRBC: 0 % (ref 0.0–0.2)

## 2019-03-25 LAB — URINALYSIS, COMPLETE (UACMP) WITH MICROSCOPIC
Bacteria, UA: NONE SEEN
Bilirubin Urine: NEGATIVE
Glucose, UA: NEGATIVE mg/dL
Hgb urine dipstick: NEGATIVE
Ketones, ur: NEGATIVE mg/dL
Nitrite: NEGATIVE
Protein, ur: NEGATIVE mg/dL
Specific Gravity, Urine: 1.017 (ref 1.005–1.030)
pH: 6 (ref 5.0–8.0)

## 2019-03-25 LAB — COMPREHENSIVE METABOLIC PANEL
ALT: 19 U/L (ref 0–44)
AST: 17 U/L (ref 15–41)
Albumin: 4 g/dL (ref 3.5–5.0)
Alkaline Phosphatase: 51 U/L (ref 38–126)
Anion gap: 10 (ref 5–15)
BUN: 26 mg/dL — ABNORMAL HIGH (ref 6–20)
CO2: 28 mmol/L (ref 22–32)
Calcium: 9 mg/dL (ref 8.9–10.3)
Chloride: 101 mmol/L (ref 98–111)
Creatinine, Ser: 0.94 mg/dL (ref 0.44–1.00)
GFR calc Af Amer: >60 mL/min (ref >60)
GFR calc non Af Amer: >60 mL/min (ref >60)
Glucose, Bld: 100 mg/dL — ABNORMAL HIGH (ref 70–99)
Potassium: 3.9 mmol/L (ref 3.5–5.1)
Sodium: 139 mmol/L (ref 135–145)
Total Bilirubin: 0.8 mg/dL (ref 0.3–1.2)
Total Protein: 7.1 g/dL (ref 6.5–8.1)

## 2019-03-25 LAB — LIPASE, BLOOD: Lipase: 32 U/L (ref 11–51)

## 2019-03-25 MED ORDER — SODIUM CHLORIDE 0.9% FLUSH: 3.0000 mL | Freq: Once | INTRAVENOUS | Status: DC

## 2019-03-25 MED ORDER — KETOROLAC TROMETHAMINE 30 MG/ML IJ SOLN
30.0000 mg | Freq: Once | INTRAMUSCULAR | Status: AC
  Administered 2019-03-25: 30 mg via INTRAMUSCULAR
  Filled 2019-03-25: qty 1

## 2019-03-25 NOTE — ED Notes (Signed)
2 failed blood draw attempts from this RN and EDT Zach, lab called for difficult blood draw

## 2019-03-25 NOTE — ED Provider Notes (Signed)
[facility] Emergency Department Provider Note  ____________________________________________   First MD Initiated Contact with Patient 03/25/19 2308     (approximate)  I have reviewed the triage vital signs and the nursing notes.   HISTORY  Chief Complaint Abdominal Pain    HPI Kayla Christensen is a 53 y.o. female with medical history as listed below  and presents by private vehicle for evaluation of a little over 24 hours of pain in her right flank.  She says that is been coming and going but getting steadily worse.  She is also had some urinary frequency with small streams of urine but no dysuria.  She said it feels similar to when she had an episode of kidney stones few decades ago.  The pain is sharp and in her right flank and radiates just a little bit to the right side of her abdomen but not the right lower quadrant.  She has no recent injuries or trauma.  It does not feel like a pulled muscle.  She has had some nausea but no vomiting.  She denies fever, sore throat, chest pain, cough, shortness of breath, vomiting, and diarrhea.  She has never had any abdominal surgeries.  She has not been around anyone known to have COVID-19.  The pain can be as severe at times and right now it is at least moderate.  Nothing in particular makes it better or worse.        Past Medical History:  Diagnosis Date  . History of kidney stones   . Hypertension   . Morbid obesity [facility](HCC)     Patient Active Problem List   Diagnosis Date Noted  . Sepsis due to pneumonia (HCC) 09/09/2018    Past Surgical History:  Procedure Laterality Date  . THYROIDECTOMY    . TUBAL LIGATION      Prior to Admission medications   Medication Sig Start Date End Date Taking? Authorizing Provider  atorvastatin (LIPITOR) 40 MG tablet Take 40 mg by mouth daily. 03/22/18   [provider]  carvedilol (COREG) 25 MG tablet Take 25 mg by mouth 2 (two) times daily with a meal.  03/22/18   [provider]  docusate sodium (COLACE) 100 MG capsule Take 1 tablet once or twice daily as needed for constipation while taking narcotic pain medicine 03/26/19   Loleta RoseForbach, Jabbar Palmero, MD  famotidine (PEPCID) 20 MG tablet Take 1 tablet (20 mg total) by mouth 2 (two) times daily for 10 days. 03/15/19 03/25/19  Menshew, Charlesetta IvoryJenise V Bacon, PA-C  HYDROcodone-acetaminophen (NORCO/VICODIN) 5-325 MG tablet Take 2 tablets by mouth every 6 (six) hours as needed for moderate pain or severe pain. 03/26/19   Loleta RoseForbach, Brunella Wileman, MD  levothyroxine (SYNTHROID, LEVOTHROID) 100 MCG tablet Take 1 tablet (100 mcg total) by mouth every morning. 09/13/18   Salary, Evelena AsaMontell D, MD  lisinopril-hydrochlorothiazide (PRINZIDE,ZESTORETIC) 20-12.5 MG tablet Take 1 tablet by mouth daily. 03/22/18   [provider]  loratadine (CLARITIN) 10 MG tablet Take 1 tablet (10 mg total) by mouth daily. 09/12/18   Salary, Evelena AsaMontell D, MD  Multiple Vitamin (MULTIVITAMIN) capsule Take 1 capsule by mouth daily. 12/18/10   [provider]  ondansetron (ZOFRAN ODT) 4 MG disintegrating tablet Allow 1-2 tablets to dissolve in your mouth every 8 hours as needed for nausea/vomiting 03/26/19   Loleta RoseForbach, Skarlett Sedlacek, MD  amLODipine (NORVASC) 5 MG tablet Take 1 tablet (5 mg total) by mouth daily. 09/12/18 03/15/19  Salary, Evelena AsaMontell D, MD  potassium  chloride (KLOR-CON) 20 MEQ packet Take 20 mEq by mouth daily. 09/12/18 03/15/19  Salary, Avel Peace, MD    Allergies Lisinopril and Penicillins  History reviewed. No pertinent family history.  Social History Social History   Tobacco Use  . Smoking status: Never Smoker  . Smokeless tobacco: Never Used  Substance Use Topics  . Alcohol use: No  . Drug use: No    Review of Systems Constitutional: No fever/chills Eyes: No visual changes. ENT: No sore throat. Cardiovascular: Denies chest pain. Respiratory: Denies shortness of breath. Gastrointestinal: No abdominal pain but pain in the right flank.   Nausea, no vomiting.  No diarrhea.  No constipation. Genitourinary: Negative for dysuria. Musculoskeletal: Negative for neck pain.  Pain in the right flank. Integumentary: Negative for rash. Neurological: Negative for headaches, focal weakness or numbness.   ____________________________________________   PHYSICAL EXAM:  VITAL SIGNS: ED Triage Vitals  Enc Vitals Group     BP 03/25/19 1635 (!) 190/100     Pulse Rate 03/25/19 1635 (!) 55     Resp 03/25/19 1635 20     Temp 03/25/19 1635 98.9 F (37.2 C)     Temp Source 03/25/19 1635 Oral     SpO2 03/25/19 1635 99 %     Weight 03/25/19 1639 115.2 kg (254 lb)     Height 03/25/19 1639 1.778 m (5\' 10" )     Head Circumference --      Peak Flow --      Pain Score 03/25/19 1638 8     Pain Loc --      Pain Edu? --      Excl. in [place]? --     Constitutional: Alert and oriented.  No acute distress but does appear uncomfortable. Eyes: Conjunctivae are normal.  Head: Atraumatic. Nose: No congestion/rhinnorhea. Mouth/Throat: Mucous membranes are moist. Neck: No stridor.  No meningeal signs.   Cardiovascular: Normal rate, regular rhythm. Good peripheral circulation. Grossly normal heart sounds. Respiratory: Normal respiratory effort.  No retractions. Gastrointestinal: Soft and nontender. No distention.  Musculoskeletal: No lower extremity tenderness nor edema. No gross deformities of extremities.  Mild right CVA tenderness to percussion. Neurologic:  Normal speech and language. No gross focal neurologic deficits are appreciated.  Skin:  Skin is warm, dry and intact. Psychiatric: Mood and affect are normal. Speech and behavior are normal.  ____________________________________________   LABS (all labs ordered are listed, but only abnormal results are displayed)  Labs Reviewed  COMPREHENSIVE METABOLIC PANEL - Abnormal; Notable for the following components:      Result Value   Glucose, Bld 100 (*)    BUN 26 (*)    All other components  within normal limits  CBC - Abnormal; Notable for the following components:   Hemoglobin 11.8 (*)    RDW 16.4 (*)    All other components within normal limits  URINALYSIS, COMPLETE (UACMP) WITH MICROSCOPIC - Abnormal; Notable for the following components:   Color, Urine YELLOW (*)    APPearance CLEAR (*)    Leukocytes,Ua TRACE (*)    All other components within normal limits  URINE CULTURE  LIPASE, BLOOD   ____________________________________________  EKG  None - EKG not ordered by ED physician ____________________________________________  RADIOLOGY Ursula Alert, personally viewed and evaluated these images (plain radiographs) as part of my medical decision making, as well as reviewing the written report by the radiologist.  ED MD interpretation:  Small pericardial effusion, no acute abnormality to explain patient's symptoms  Official radiology  report(s): Ct Renal Stone Study  Result Date: 03/26/2019 CLINICAL DATA:  Flank pain EXAM: CT ABDOMEN AND PELVIS WITHOUT CONTRAST TECHNIQUE: Multidetector CT imaging of the abdomen and pelvis was performed following the standard protocol without IV contrast. COMPARISON:  01/20/2014 FINDINGS: LOWER CHEST: Small pericardial effusion measuring 6 mm in thickness is new since the prior study. Small left pleural effusion. HEPATOBILIARY: The hepatic contours and density are normal. There is no intra- or extrahepatic biliary dilatation. There is cholelithiasis without acute inflammation. PANCREAS: The pancreatic parenchymal contours are normal and there is no ductal dilatation. There is no peripancreatic fluid collection. SPLEEN: Normal. ADRENALS/URINARY TRACT: --Adrenal glands: Normal. --Right kidney/ureter: No hydronephrosis, nephroureterolithiasis, perinephric stranding or solid renal mass. --Left kidney/ureter: No hydronephrosis, nephroureterolithiasis, perinephric stranding or solid renal mass. --Urinary bladder: Normal for degree of distention  STOMACH/BOWEL: --Stomach/Duodenum: There is no hiatal hernia or other gastric abnormality. The duodenal course and caliber are normal. --Small bowel: No dilatation or inflammation. --Colon: No focal abnormality. --Appendix: Normal. VASCULAR/LYMPHATIC: Normal course and caliber of the major abdominal vessels. No abdominal or pelvic lymphadenopathy. REPRODUCTIVE: Right fundal fibroid measures 3.2 cm. MUSCULOSKELETAL. No bony spinal canal stenosis or focal osseous abnormality. OTHER: None. IMPRESSION: 1. No obstructive uropathy or nephrolithiasis. 2. New small pericardial effusion and small left pleural effusion. 3. Cholelithiasis without acute inflammation. 4. Uterine fibroid. Electronically Signed   By: Deatra Robinson M.D.   On: 03/26/2019 01:09    ____________________________________________   PROCEDURES   Procedure(s) performed (including Critical Care):  Procedures   ____________________________________________   INITIAL IMPRESSION / MDM / ASSESSMENT AND PLAN / ED COURSE  As part of my medical decision making, I reviewed the following data within the electronic MEDICAL RECORD NUMBER Nursing notes reviewed and incorporated, Labs reviewed , Old chart reviewed, Notes from prior ED visits and [place] Controlled Substance Database   Differential diagnosis includes, but is not limited to, renal/ureteral colic, UTI/pyelonephritis, musculoskeletal strain, biliary colic, less likely appendicitis.  The patient has not had any signs or symptoms of UTI; she first started having the right flank pain before she started having increased urinary frequency.  Her history and signs/symptoms are strongly suggestive of ureteral colic.  Her lab work is notable for a normal CBC with no leukocytosis, and normal comprehensive metabolic panel, normal lipase.  Her urinalysis shows a few leukocytes but no visible WBCs or RBCs, no bacteria seen, and negative nitrates.  While it is theoretically possible she could have an infection  proximal to the ureteral stone, I think it is unlikely based on her work-up and her history.  She is very uncomfortable but she drove herself tonight so she agreed to Toradol 30 mg intramuscular.  We will obtain a CT renal study protocol and reassess.  Anticipate discharge with outpatient management and urology follow-up.       Clinical Course as of Mar 25 120  Wynelle Link Mar 26, 2019  0117 Reassuring CT scan, no clear explanation for patient's symptoms.  Discussed with patient, encouraged outpatient follow up, gave usual/customary return precautions.  Of note, mention of a small pericardial effusion, but this is unlikely to be causing her right flank pain, and she is having no other concerning signs or symptoms.  CT Renal [place] [CF]    Clinical Course User Index [CF] Loleta Rose, MD     ____________________________________________  FINAL CLINICAL IMPRESSION(S) / ED DIAGNOSES  Final diagnoses:  Acute right flank pain     MEDICATIONS GIVEN DURING THIS VISIT:  Medications  sodium chloride flush (NS) 0.9 % injection 3 mL (has no administration in time range)  ketorolac (TORADOL) 30 MG/ML injection 30 mg (30 mg Intramuscular Given 03/25/19 2351)     ED Discharge Orders         Ordered    HYDROcodone-acetaminophen (NORCO/VICODIN) 5-325 MG tablet  Every 6 hours PRN     03/26/19 0120    ondansetron (ZOFRAN ODT) 4 MG disintegrating tablet     03/26/19 0120    docusate sodium (COLACE) 100 MG capsule     03/26/19 0120          *Please note:  Kayla Christensen was evaluated in Emergency Department on 03/26/2019 for the symptoms described in the history of present illness. She was evaluated in the context of the global COVID-19 pandemic, which necessitated consideration that the patient might be at risk for infection with the SARS-CoV-2 virus that causes COVID-19. Institutional protocols and algorithms that pertain to the evaluation of patients at risk for COVID-19 are in a state  of rapid change based on information released by regulatory bodies including the CDC and federal and state organizations. These policies and algorithms were followed during the patient's care in the ED.  Some ED evaluations and interventions may be delayed as a result of limited staffing during the pandemic.*  Note:  This document was prepared using Dragon voice recognition software and may include unintentional dictation errors.   Loleta RoseForbach, Toleen Lachapelle, MD 03/26/19 [id number]

## 2019-03-25 NOTE — ED Triage Notes (Signed)
R mid abdominal pain began yesterday. States frequency of urination.

## 2019-03-26 ENCOUNTER — Emergency Department: Payer: No Typology Code available for payment source

## 2019-03-26 MED ORDER — HYDROCODONE-ACETAMINOPHEN 5-325 MG PO TABS: 2.0000 | ORAL_TABLET | Freq: Four times a day (QID) | ORAL | 0 refills | Status: DC | PRN

## 2019-03-26 MED ORDER — DOCUSATE SODIUM 100 MG PO CAPS: ORAL_CAPSULE | ORAL | 0 refills | Status: DC

## 2019-03-26 MED ORDER — ONDANSETRON 4 MG PO TBDP: ORAL_TABLET | ORAL | 0 refills | Status: DC

## 2019-03-26 NOTE — Discharge Instructions (Signed)
You have been seen in the Emergency Department (ED) for flank/abdominal pain.  Your evaluation did not identify a clear cause of your symptoms but was generally reassuring.  Please follow up as instructed above regarding todays emergent visit and the symptoms that are bothering you.  Return to the ED if your abdominal pain worsens or fails to improve, you develop bloody vomiting, bloody diarrhea, you are unable to tolerate fluids due to vomiting, fever greater than 101, or other symptoms that concern you.

## 2019-03-27 LAB — URINE CULTURE
Culture: 20000 — AB
Special Requests: NORMAL

## 2019-04-08 ENCOUNTER — Ambulatory Visit
Admission: EM | Admit: 2019-04-08 | Discharge: 2019-04-08 | Disposition: A | Discharge status: Home / Self Care | Payer: No Typology Code available for payment source | Attending: Family Medicine | Admitting: Family Medicine

## 2019-04-08 ENCOUNTER — Encounter: Payer: Self-pay | Admitting: Emergency Medicine

## 2019-04-08 ENCOUNTER — Other Ambulatory Visit: Payer: Self-pay

## 2019-04-08 DIAGNOSIS — N390 Urinary tract infection, site not specified: Secondary | ICD-10-CM | POA: Diagnosis not present

## 2019-04-08 DIAGNOSIS — R109 Unspecified abdominal pain: Secondary | ICD-10-CM

## 2019-04-08 DIAGNOSIS — K8020 Calculus of gallbladder without cholecystitis without obstruction: Secondary | ICD-10-CM | POA: Diagnosis not present

## 2019-04-08 LAB — URINALYSIS, COMPLETE (UACMP) WITH MICROSCOPIC
Bilirubin Urine: NEGATIVE
Glucose, UA: NEGATIVE mg/dL
Ketones, ur: NEGATIVE mg/dL
Leukocytes,Ua: NEGATIVE
Nitrite: NEGATIVE
Protein, ur: NEGATIVE mg/dL
Specific Gravity, Urine: 1.025 (ref 1.005–1.030)
WBC, UA: NONE SEEN WBC/hpf (ref 0–5)
pH: 5.5 (ref 5.0–8.0)

## 2019-04-08 MED ORDER — SULFAMETHOXAZOLE-TRIMETHOPRIM 800-160 MG PO TABS: 1.0000 | ORAL_TABLET | Freq: Two times a day (BID) | ORAL | 0 refills | Status: DC

## 2019-04-08 MED ORDER — TRAMADOL HCL 50 MG PO TABS: 50.0000 mg | ORAL_TABLET | Freq: Four times a day (QID) | ORAL | 0 refills | Status: DC | PRN

## 2019-04-08 NOTE — ED Triage Notes (Signed)
Patient c/o right sided abdominal pain that started last night.  Patient denies fever.  Patient denies N/V.

## 2019-04-08 NOTE — Discharge Instructions (Signed)
Avoid fatty meals Increase water intake Follow up with Primary care provider

## 2019-04-09 LAB — URINE CULTURE
Culture: NO GROWTH
Special Requests: NORMAL

## 2019-04-09 NOTE — ED Provider Notes (Signed)
[facility]    CSN: [id number] Arrival date & time: 04/08/19  1508      History   Chief Complaint Chief Complaint  Patient presents with  . Abdominal Pain    right    HPI Kayla Christensen is a 53 y.o. female.   53 yo female with a c/o right sided abdominal pain since last night after eating. Patient has had similar intermittent symptoms for the past several weeks and was recently seen in the ED where she had a CT scan. Patient denies any fevers, chills, nausea, vomiting, melena, hematochezia, dysuria, hematuria, diarrhea, constipation.    Abdominal Pain   Past Medical History:  Diagnosis Date  . History of kidney stones   . Hypertension   . Morbid obesity [facility])     Patient Active Problem List   Diagnosis Date Noted  . Sepsis due to pneumonia ([place]) 09/09/2018    Past Surgical History:  Procedure Laterality Date  . THYROIDECTOMY    . TUBAL LIGATION      OB History   No obstetric history on file.      Home Medications    Prior to Admission medications   Medication Sig Start Date End Date Taking? Authorizing Provider  atorvastatin (LIPITOR) 40 MG tablet Take 40 mg by mouth daily. 03/22/18  Yes [provider]  carvedilol (COREG) 25 MG tablet Take 25 mg by mouth 2 (two) times daily with a meal. 03/22/18  Yes [provider]  levothyroxine (SYNTHROID, LEVOTHROID) 100 MCG tablet Take 1 tablet (100 mcg total) by mouth every morning. 09/13/18  Yes Salary, Avel Peace, MD  loratadine (CLARITIN) 10 MG tablet Take 1 tablet (10 mg total) by mouth daily. 09/12/18  Yes Salary, Avel Peace, MD  Multiple Vitamin (MULTIVITAMIN) capsule Take 1 capsule by mouth daily. 12/18/10  Yes [provider]  olmesartan (BENICAR) 40 MG tablet Take 40 mg by mouth daily. 03/24/19  Yes [provider]  spironolactone (ALDACTONE) 25 MG tablet Take by mouth. 03/16/19 03/15/20 Yes [provider]  docusate sodium (COLACE) 100 MG capsule Take 1  tablet once or twice daily as needed for constipation while taking narcotic pain medicine 03/26/19   Hinda Kehr, MD  famotidine (PEPCID) 20 MG tablet Take 1 tablet (20 mg total) by mouth 2 (two) times daily for 10 days. 03/15/19 03/25/19  Menshew, Dannielle Karvonen, PA-C  HYDROcodone-acetaminophen (NORCO/VICODIN) 5-325 MG tablet Take 2 tablets by mouth every 6 (six) hours as needed for moderate pain or severe pain. 03/26/19   Hinda Kehr, MD  lisinopril-hydrochlorothiazide (PRINZIDE,ZESTORETIC) 20-12.5 MG tablet Take 1 tablet by mouth daily. 03/22/18   [provider]  ondansetron (ZOFRAN ODT) 4 MG disintegrating tablet Allow 1-2 tablets to dissolve in your mouth every 8 hours as needed for nausea/vomiting 03/26/19   Hinda Kehr, MD  sulfamethoxazole-trimethoprim (BACTRIM DS) 800-160 MG tablet Take 1 tablet by mouth 2 (two) times daily. 04/08/19   Norval Gable, MD  traMADol (ULTRAM) 50 MG tablet Take 1 tablet (50 mg total) by mouth every 6 (six) hours as needed. 04/08/19   Norval Gable, MD  amLODipine (NORVASC) 5 MG tablet Take 1 tablet (5 mg total) by mouth daily. 09/12/18 03/15/19  Salary, Avel Peace, MD  potassium chloride (KLOR-CON) 20 MEQ packet Take 20 mEq by mouth daily. 09/12/18 03/15/19  Salary, Avel Peace, MD    Family History History reviewed. No pertinent family history.  Social History Social History   Tobacco Use  . Smoking  status: Never Smoker  . Smokeless tobacco: Never Used  Substance Use Topics  . Alcohol use: No  . Drug use: No     Allergies   Amlodipine, Lisinopril, and Penicillins   Review of Systems Review of Systems  Gastrointestinal: Positive for abdominal pain.     Physical Exam Triage Vital Signs ED Triage Vitals  Enc Vitals Group     BP 04/08/19 1516 (S) (!) 181/113     Pulse Rate 04/08/19 1516 73     Resp 04/08/19 1516 16     Temp 04/08/19 1516 98.6 F (37 C)     Temp Source 04/08/19 1516 Oral     SpO2 04/08/19 1516 99 %     Weight 04/08/19  1513 250 lb (113.4 kg)     Height 04/08/19 1513 5\' 9"  (1.753 m)     Head Circumference --      Peak Flow --      Pain Score 04/08/19 1513 10     Pain Loc --      Pain Edu? --      Excl. in [place]? --    No data found.  Updated Vital Signs BP (S) (!) 181/113 (BP Location: Right Arm)   Pulse 73   Temp 98.6 F (37 C) (Oral)   Resp 16   Ht 5\' 9"  (1.753 m)   Wt 113.4 kg   SpO2 99%   BMI 36.92 kg/m   Visual Acuity Right Eye Distance:   Left Eye Distance:   Bilateral Distance:    Right Eye Near:   Left Eye Near:    Bilateral Near:     Physical Exam Vitals signs and nursing note reviewed.  Constitutional:      General: She is not in acute distress.    Appearance: She is not toxic-appearing or diaphoretic.  Abdominal:     General: Bowel sounds are normal. There is no distension.     Palpations: Abdomen is soft. There is no mass.     Tenderness: There is no abdominal tenderness. There is no right CVA tenderness, left CVA tenderness, guarding or rebound.     Hernia: No hernia is present.  Neurological:     Mental Status: She is alert.      UC Treatments / Results  Labs (all labs ordered are listed, but only abnormal results are displayed) Labs Reviewed  URINALYSIS, COMPLETE (UACMP) WITH MICROSCOPIC - Abnormal; Notable for the following components:      Result Value   Hgb urine dipstick TRACE (*)    Bacteria, UA RARE (*)    All other components within normal limits  URINE CULTURE    EKG   Radiology No results found.  Procedures Procedures (including critical care time)  Medications Ordered in UC Medications - No data to display  Initial Impression / Assessment and Plan / UC Course  I have reviewed the triage vital signs and the nursing notes.  Pertinent labs & imaging results that were available during my care of the patient were reviewed by me and considered in my medical decision making (see chart for details).      Final Clinical Impressions(s) / UC  Diagnoses   Final diagnoses:  Right flank pain  Gallstones  Lower urinary tract infectious disease     Discharge Instructions     Avoid fatty meals Increase water intake Follow up with Primary care provider    ED Prescriptions    Medication Sig Dispense Auth. Provider   sulfamethoxazole-trimethoprim (  BACTRIM DS) 800-160 MG tablet Take 1 tablet by mouth 2 (two) times daily. 10 tablet Payton Mccallumonty, Shell Yandow, MD   traMADol (ULTRAM) 50 MG tablet Take 1 tablet (50 mg total) by mouth every 6 (six) hours as needed. 15 tablet Payton Mccallumonty, Ciani Rutten, MD     1. Lab results and diagnosis reviewed with patient 2. rx as per orders above; reviewed possible side effects, interactions, risks and benefits  3. Recommend supportive treatment as above 4. Follow-up prn if symptoms worsen or don't improve  I have reviewed the PDMP during this encounter.   Payton Mccallumonty, Remy Dia, MD 04/09/19 [phone]

## 2019-04-23 ENCOUNTER — Ambulatory Visit
Admission: EM | Admit: 2019-04-23 | Discharge: 2019-04-23 | Disposition: A | Discharge status: Home / Self Care | Payer: No Typology Code available for payment source | Attending: Family Medicine | Admitting: Family Medicine

## 2019-04-23 ENCOUNTER — Other Ambulatory Visit: Payer: Self-pay

## 2019-04-23 DIAGNOSIS — H11422 Conjunctival edema, left eye: Secondary | ICD-10-CM

## 2019-04-23 DIAGNOSIS — H11432 Conjunctival hyperemia, left eye: Secondary | ICD-10-CM

## 2019-04-23 MED ORDER — AZELASTINE HCL 0.05 % OP SOLN: 1.0000 [drp] | Freq: Two times a day (BID) | OPHTHALMIC | 12 refills | Status: DC

## 2019-04-23 MED ORDER — CIPROFLOXACIN HCL 0.3 % OP SOLN: OPHTHALMIC | 0 refills | Status: DC

## 2019-04-23 NOTE — ED Provider Notes (Signed)
[facility]    CSN: [id number] Arrival date & time: 04/23/19  [id number]   History   Chief Complaint Chief Complaint  Patient presents with  . Eye Pain    left   HPI  53 year old female presents with an eye complaint.  Patient reports that she developed left eye irritation yesterday.  She felt as if she had something in her eye.  She does not recall getting anything in her eye.  She rinsed her eye out and use an over-the-counter eyedrop and it seemed to be improved.  However, this morning she awoke with severe eye redness.  Denies photophobia.  Denies vision changes.  She states that it is "sore".  Denies pain.  She does state that it has been crusty/draining.  Denies itching.  No known inciting factor.  No known exacerbating factors.  No other complaints.  Hx reviewed as below. Past Medical History:  Diagnosis Date  . History of kidney stones   . Hypertension   . Morbid obesity (HCC)   Cholelithiasis Osteoarthritis Hypothyroidism Hyperlipidemia  Patient Active Problem List   Diagnosis Date Noted  . Sepsis due to pneumonia (HCC) 09/09/2018   Past Surgical History:  Procedure Laterality Date  . THYROIDECTOMY    . TUBAL LIGATION     OB History   No obstetric history on file.    Home Medications    Prior to Admission medications   Medication Sig Start Date End Date Taking? Authorizing Provider  atorvastatin (LIPITOR) 40 MG tablet Take 40 mg by mouth daily. 03/22/18  Yes [provider]  carvedilol (COREG) 25 MG tablet Take 25 mg by mouth 2 (two) times daily with a meal. 03/22/18  Yes [provider]  levothyroxine (SYNTHROID, LEVOTHROID) 100 MCG tablet Take 1 tablet (100 mcg total) by mouth every morning. 09/13/18  Yes Salary, Evelena AsaMontell D, MD  loratadine (CLARITIN) 10 MG tablet Take 1 tablet (10 mg total) by mouth daily. 09/12/18  Yes Salary, Evelena AsaMontell D, MD  olmesartan (BENICAR) 40 MG tablet Take 40 mg by mouth daily. 03/24/19  Yes [provider]  spironolactone (ALDACTONE) 25 MG tablet Take by mouth. 03/16/19 03/15/20 Yes [provider]  azelastine (OPTIVAR) 0.05 % ophthalmic solution Place 1 drop into the left eye 2 (two) times daily. 04/23/19   Tommie Samsook, Shonique Pelphrey G, DO  ciprofloxacin (CILOXAN) 0.3 % ophthalmic solution Administer 1 drop, every 2 hours, while awake, for 2 days. Then 1 drop, every 4 hours, while awake, for the next 5 days. 04/23/19   Tommie Samsook, Jewelz Kobus G, DO  hydrALAZINE (APRESOLINE) 25 MG tablet Take 25 mg by mouth 2 (two) times daily. 04/13/19   [provider]  ondansetron (ZOFRAN ODT) 4 MG disintegrating tablet Allow 1-2 tablets to dissolve in your mouth every 8 hours as needed for nausea/vomiting 03/26/19   Loleta RoseForbach, Cory, MD  amLODipine (NORVASC) 5 MG tablet Take 1 tablet (5 mg total) by mouth daily. 09/12/18 03/15/19  Salary, Evelena AsaMontell D, MD  famotidine (PEPCID) 20 MG tablet Take 1 tablet (20 mg total) by mouth 2 (two) times daily for 10 days. 03/15/19 04/23/19  Menshew, Charlesetta IvoryJenise V Bacon, PA-C  lisinopril-hydrochlorothiazide (PRINZIDE,ZESTORETIC) 20-12.5 MG tablet Take 1 tablet by mouth daily. 03/22/18 04/23/19  [provider]  potassium chloride (KLOR-CON) 20 MEQ packet Take 20 mEq by mouth daily. 09/12/18 03/15/19  Salary, Evelena AsaMontell D, MD   Social History Social History   Tobacco Use  . Smoking status: Never Smoker  . Smokeless tobacco: Never Used  Substance Use Topics  . Alcohol use: No  . Drug use: No     Allergies   Amlodipine, Lisinopril, and Penicillins   Review of Systems Review of Systems  Constitutional: Negative.   Eyes: Positive for discharge and redness. Negative for photophobia and visual disturbance.   Physical Exam Triage Vital Signs ED Triage Vitals  Enc Vitals Group     BP 04/23/19 0837 (!) 178/101     Pulse Rate 04/23/19 0837 63     Resp 04/23/19 0837 18     Temp 04/23/19 0837 98.2 F (36.8 C)     Temp Source 04/23/19 0837 Oral     SpO2 04/23/19 0837 99 %     Weight 04/23/19  0833 250 lb (113.4 kg)     Height 04/23/19 0833 5\' 9"  (1.753 m)     Head Circumference --      Peak Flow --      Pain Score 04/23/19 0832 0     Pain Loc --      Pain Edu? --      Excl. in [place]? --    Updated Vital Signs BP (!) 178/101 (BP Location: Right Arm)   Pulse 63   Temp 98.2 F (36.8 C) (Oral)   Resp 18   Ht 5\' 9"  (1.753 m)   Wt 113.4 kg   SpO2 99%   BMI 36.92 kg/m   Visual Acuity Right Eye Distance: 20/20 Left Eye Distance: 20/20 Bilateral Distance: 20/20(uncorrected)  Right Eye Near:   Left Eye Near:    Bilateral Near:     Physical Exam Vitals signs and nursing note reviewed.  Constitutional:      General: She is not in acute distress.    Appearance: Normal appearance. She is obese. She is not ill-appearing.  HENT:     Head: Normocephalic and atraumatic.  Eyes:     Comments: Left eye with diffuse conjunctival erythema and chemosis.  PERRLA.  No appreciable discharge at this time.  No photophobia.  Cardiovascular:     Rate and Rhythm: Normal rate and regular rhythm.     Heart sounds: No murmur.  Pulmonary:     Effort: Pulmonary effort is normal.     Breath sounds: Normal breath sounds. No wheezing, rhonchi or rales.  Neurological:     Mental Status: She is alert.  Psychiatric:        Mood and Affect: Mood normal.        Behavior: Behavior normal.    UC Treatments / Results  Labs (all labs ordered are listed, but only abnormal results are displayed) Labs Reviewed - No data to display  EKG   Radiology No results found.  Procedures Procedures (including critical care time)  Medications Ordered in UC Medications - No data to display  Initial Impression / Assessment and Plan / UC Course  I have reviewed the triage vital signs and the nursing notes.  Pertinent labs & imaging results that were available during my care of the patient were reviewed by me and considered in my medical decision making (see chart for details).    53 year old female  presents with left eye chemosis and conjunctival erythema/hyperemia.  Placing empirically on Cipro eyedrops to cover for infection.  Placing on as lasting as this appears to be allergic related given severe ecchymosis.  Advised to call [facility] if she fails to improve or worsens.  Final Clinical Impressions(s) / UC Diagnoses   Final diagnoses:  Chemosis of left conjunctiva  Conjunctival hyperemia of left eye     Discharge Instructions     Medications as directed.  If you worsen or fail to improve, call [facility] urgently - [phone]  Dr. Adriana Simas    ED Prescriptions    Medication Sig Dispense Auth. Provider   ciprofloxacin (CILOXAN) 0.3 % ophthalmic solution Administer 1 drop, every 2 hours, while awake, for 2 days. Then 1 drop, every 4 hours, while awake, for the next 5 days. 10 mL Mahrukh Seguin G, DO   azelastine (OPTIVAR) 0.05 % ophthalmic solution Place 1 drop into the left eye 2 (two) times daily. 6 mL Tommie Sams, DO     PDMP not reviewed this encounter.   Everlene Other [place], [place] 04/23/19 [phone]

## 2019-04-23 NOTE — Discharge Instructions (Signed)
Medications as directed.  If you worsen or fail to improve, call [facility] urgently - [phone]  Dr. Lacinda Axon

## 2019-04-23 NOTE — ED Triage Notes (Signed)
Patient complains of left eye redness and swelling. Patient states that eye started feeling scratchy yesterday, rinse several times with salt water. Patient states that she woke up this morning and eye is much worse.

## 2020-03-23 ENCOUNTER — Other Ambulatory Visit: Payer: Self-pay

## 2020-03-23 ENCOUNTER — Ambulatory Visit
Admission: EM | Admit: 2020-03-23 | Discharge: 2020-03-23 | Disposition: A | Discharge status: Home / Self Care | Payer: No Typology Code available for payment source | Attending: Family | Admitting: Family

## 2020-03-23 ENCOUNTER — Ambulatory Visit (INDEPENDENT_AMBULATORY_CARE_PROVIDER_SITE_OTHER): Payer: No Typology Code available for payment source

## 2020-03-23 ENCOUNTER — Encounter: Payer: Self-pay | Admitting: Emergency Medicine

## 2020-03-23 DIAGNOSIS — R05 Cough: Secondary | ICD-10-CM | POA: Diagnosis not present

## 2020-03-23 DIAGNOSIS — M546 Pain in thoracic spine: Secondary | ICD-10-CM

## 2020-03-23 DIAGNOSIS — R062 Wheezing: Secondary | ICD-10-CM | POA: Diagnosis not present

## 2020-03-23 DIAGNOSIS — J189 Pneumonia, unspecified organism: Secondary | ICD-10-CM

## 2020-03-23 DIAGNOSIS — R6883 Chills (without fever): Secondary | ICD-10-CM

## 2020-03-23 DIAGNOSIS — Z20822 Contact with and (suspected) exposure to covid-19: Secondary | ICD-10-CM | POA: Diagnosis not present

## 2020-03-23 DIAGNOSIS — R059 Cough, unspecified: Secondary | ICD-10-CM

## 2020-03-23 MED ORDER — DOXYCYCLINE HYCLATE 100 MG PO CAPS: 100.0000 mg | ORAL_CAPSULE | Freq: Two times a day (BID) | ORAL | 0 refills | Status: DC

## 2020-03-23 MED ORDER — IPRATROPIUM-ALBUTEROL 20-100 MCG/ACT IN AERS: 1.0000 | INHALATION_SPRAY | Freq: Four times a day (QID) | RESPIRATORY_TRACT | 0 refills | Status: DC

## 2020-03-23 MED ORDER — PREDNISONE 20 MG PO TABS: 40.0000 mg | ORAL_TABLET | Freq: Every day | ORAL | 0 refills | Status: DC

## 2020-03-23 MED ORDER — PREDNISONE 20 MG PO TABS: 40.0000 mg | ORAL_TABLET | Freq: Every day | ORAL | 0 refills | Status: AC

## 2020-03-23 NOTE — ED Triage Notes (Signed)
Patient c/o upper back pain, cough, and chills for the past 2 days.  Patient states she had a headache and sinus pressure earlier this week.  Patient denies fevers.

## 2020-03-23 NOTE — Discharge Instructions (Addendum)
Recommend start Doxycycline 100mg  twice a day as directed. Start Prednisone 40mg  daily for 5 days then stop. May use Combivent inhaler 1 puff every 6 hours as needed for wheezing. Continue to push fluids to help loosen up mucus in chest. Rest. Follow-up pending COVID 19 test result and with your PCP in 2 to 3 days for recheck.

## 2020-03-24 LAB — SARS CORONAVIRUS 2 (TAT 6-24 HRS): SARS Coronavirus 2: NEGATIVE

## 2020-03-24 NOTE — ED Provider Notes (Signed)
[facility]    CSN: [id number] Arrival date & time: 03/23/20  1448      History   Chief Complaint Chief Complaint  Patient presents with   Back Pain   Cough    HPI Kayla Christensen is a 54 y.o. female.   54 year old female presents with cough, chills, and central upper back pain for the past 2 days. Started with sinus pressure and headache a few days ago that has become worse. Now having more body aches, fatigue and noticed herself wheezing last night. Denies any distinct fever or GI symptoms. Has tried taking Tylenol Sinus and AlkaSeltzer with minimal relief. Also has been using her Albuterol inhaler every 2 hours with minimal success. Had pneumonia last year and feels similar. No known exposure to COVID 19. Is fully vaccinated and had COVID infection in March 2021. Other chronic health issues include HTN, hyperlipidemia and thyroid disorder. Currently on Coreg, Benicar, Hydralazine, Spironolactone, Lipitor, Synthroid and Claritin daily.   The history is provided by the patient.    Past Medical History:  Diagnosis Date   History of kidney stones    Hypertension    Morbid obesity [facility])     Patient Active Problem List   Diagnosis Date Noted   Sepsis due to pneumonia (HCC) 09/09/2018    Past Surgical History:  Procedure Laterality Date   CHOLECYSTECTOMY     THYROIDECTOMY     TUBAL LIGATION      OB History   No obstetric history on file.      Home Medications    Prior to Admission medications   Medication Sig Start Date End Date Taking? Authorizing Provider  atorvastatin (LIPITOR) 40 MG tablet Take 40 mg by mouth daily. 03/22/18  Yes [provider]  carvedilol (COREG) 25 MG tablet Take 25 mg by mouth 2 (two) times daily with a meal. 03/22/18  Yes [provider]  hydrALAZINE (APRESOLINE) 25 MG tablet Take 25 mg by mouth 2 (two) times daily. 04/13/19  Yes [provider]  levothyroxine (SYNTHROID, LEVOTHROID) 100  MCG tablet Take 1 tablet (100 mcg total) by mouth every morning. 09/13/18  Yes Salary, Evelena Asa, MD  loratadine (CLARITIN) 10 MG tablet Take 1 tablet (10 mg total) by mouth daily. 09/12/18  Yes Salary, Evelena Asa, MD  olmesartan (BENICAR) 40 MG tablet Take 40 mg by mouth daily. 03/24/19  Yes [provider]  spironolactone (ALDACTONE) 25 MG tablet Take by mouth. 03/16/19 03/23/20 Yes [provider]  azelastine (OPTIVAR) 0.05 % ophthalmic solution Place 1 drop into the left eye 2 (two) times daily. 04/23/19   Tommie Sams, DO  ciprofloxacin (CILOXAN) 0.3 % ophthalmic solution Administer 1 drop, every 2 hours, while awake, for 2 days. Then 1 drop, every 4 hours, while awake, for the next 5 days. 04/23/19   Tommie Sams, DO  doxycycline (VIBRAMYCIN) 100 MG capsule Take 1 capsule (100 mg total) by mouth 2 (two) times daily. 03/23/20   Sudie Grumbling, NP  Ipratropium-Albuterol (COMBIVENT) 20-100 MCG/ACT AERS respimat Inhale 1 puff into the lungs every 6 (six) hours. As needed for wheezing 03/23/20   Sudie Grumbling, NP  ondansetron (ZOFRAN ODT) 4 MG disintegrating tablet Allow 1-2 tablets to dissolve in your mouth every 8 hours as needed for nausea/vomiting 03/26/19   Loleta Rose, MD  predniSONE (DELTASONE) 20 MG tablet Take 2 tablets (40 mg total) by mouth daily for 5 days. 03/23/20 03/28/20  Randall Hiss  Allyson Sabal, NP  amLODipine (NORVASC) 5 MG tablet Take 1 tablet (5 mg total) by mouth daily. 09/12/18 03/15/19  Salary, Evelena Asa, MD  famotidine (PEPCID) 20 MG tablet Take 1 tablet (20 mg total) by mouth 2 (two) times daily for 10 days. 03/15/19 04/23/19  Menshew, Charlesetta Ivory, PA-C  lisinopril-hydrochlorothiazide (PRINZIDE,ZESTORETIC) 20-12.5 MG tablet Take 1 tablet by mouth daily. 03/22/18 04/23/19  [provider]  potassium chloride (KLOR-CON) 20 MEQ packet Take 20 mEq by mouth daily. 09/12/18 03/15/19  Salary, Evelena Asa, MD    Family History History reviewed. No pertinent family  history.  Social History Social History   Tobacco Use   Smoking status: Never Smoker   Smokeless tobacco: Never Used  Building services engineer Use: Never used  Substance Use Topics   Alcohol use: No   Drug use: No     Allergies   Amlodipine, Lisinopril, and Penicillins   Review of Systems Review of Systems  Constitutional: Positive for chills and fatigue. Negative for activity change, appetite change and fever.  HENT: Positive for congestion, rhinorrhea, sinus pressure and sinus pain. Negative for ear discharge, ear pain, facial swelling, mouth sores, nosebleeds, sneezing and trouble swallowing.   Eyes: Negative for pain and itching.  Respiratory: Positive for cough, chest tightness, shortness of breath and wheezing.   Cardiovascular: Positive for chest pain (mostly central back).  Gastrointestinal: Negative for diarrhea, nausea and vomiting.  Musculoskeletal: Positive for arthralgias, back pain and myalgias. Negative for neck pain and neck stiffness.  Skin: Negative for color change, rash and wound.  Allergic/Immunologic: Positive for environmental allergies. Negative for food allergies and immunocompromised state.  Neurological: Positive for headaches. Negative for dizziness, seizures, syncope, weakness, light-headedness and numbness.  Hematological: Negative for adenopathy. Does not bruise/bleed easily.     Physical Exam Triage Vital Signs ED Triage Vitals  Enc Vitals Group     BP 03/23/20 1520 (!) 176/89     Pulse Rate 03/23/20 1520 75     Resp 03/23/20 1520 16     Temp 03/23/20 1520 98.4 F (36.9 C)     Temp Source 03/23/20 1520 Oral     SpO2 03/23/20 1520 96 %     Weight 03/23/20 1516 247 lb (112 kg)     Height 03/23/20 1516 5\' 9"  (1.753 m)     Head Circumference --      Peak Flow --      Pain Score 03/23/20 1516 7     Pain Loc --      Pain Edu? --      Excl. in [place]? --    No data found.  Updated Vital Signs BP (!) 176/89 (BP Location: Right Arm)     Pulse 75    Temp 98.4 F (36.9 C) (Oral)    Resp 16    Ht 5\' 9"  (1.753 m)    Wt 247 lb (112 kg)    SpO2 96%    BMI 36.48 kg/m   Visual Acuity Right Eye Distance:   Left Eye Distance:   Bilateral Distance:    Right Eye Near:   Left Eye Near:    Bilateral Near:     Physical Exam Vitals and nursing note reviewed.  Constitutional:      General: She is awake. She is not in acute distress.    Appearance: She is well-developed and well-groomed. She is ill-appearing.     Comments: She is sitting in the exam chair in no acute distress  but appears ill and tired.   HENT:     Head: Normocephalic and atraumatic.     Right Ear: Hearing, tympanic membrane, ear canal and external ear normal.     Left Ear: Hearing, tympanic membrane, ear canal and external ear normal.     Nose: Congestion present.     Right Sinus: Frontal sinus tenderness present. No maxillary sinus tenderness.     Left Sinus: Frontal sinus tenderness present. No maxillary sinus tenderness.     Mouth/Throat:     Lips: Pink.     Mouth: Mucous membranes are moist.     Pharynx: Oropharynx is clear. Uvula midline. No pharyngeal swelling, oropharyngeal exudate, posterior oropharyngeal erythema or uvula swelling.  Eyes:     Extraocular Movements: Extraocular movements intact.     Conjunctiva/sclera: Conjunctivae normal.  Cardiovascular:     Rate and Rhythm: Normal rate and regular rhythm.     Heart sounds: Normal heart sounds. No murmur heard.   Pulmonary:     Effort: Pulmonary effort is normal. No tachypnea or respiratory distress.     Breath sounds: Normal air entry. Examination of the right-upper field reveals wheezing and rhonchi. Examination of the left-upper field reveals wheezing and rhonchi. Examination of the right-middle field reveals wheezing. Examination of the right-lower field reveals decreased breath sounds and wheezing. Examination of the left-lower field reveals decreased breath sounds and wheezing. Decreased breath  sounds, wheezing and rhonchi present. No rales.  Musculoskeletal:        General: Normal range of motion.     Cervical back: Normal range of motion and neck supple. No rigidity.  Lymphadenopathy:     Cervical: Cervical adenopathy present.     Right cervical: Superficial cervical adenopathy present.     Left cervical: Superficial cervical adenopathy present.  Skin:    General: Skin is warm and dry.     Capillary Refill: Capillary refill takes less than 2 seconds.     Findings: No rash.  Neurological:     General: No focal deficit present.     Mental Status: She is alert and oriented to person, place, and time.  Psychiatric:        Mood and Affect: Mood normal.        Behavior: Behavior normal. Behavior is cooperative.        Thought Content: Thought content normal.        Judgment: Judgment normal.      UC Treatments / Results  Labs (all labs ordered are listed, but only abnormal results are displayed) Labs Reviewed  SARS CORONAVIRUS 2 (TAT 6-24 HRS)    EKG   Radiology DG Chest 2 View  Result Date: 03/23/2020 CLINICAL DATA:  Two day history of UPPER back pain, cough and chills. EXAM: CHEST - 2 VIEW COMPARISON:  09/09/2018 and earlier. FINDINGS: Cardiac silhouette mildly enlarged, unchanged. Hilar and mediastinal contours otherwise unremarkable. Mild streaky and patchy airspace opacities in the lingula and LEFT LOWER LOBE. Lungs otherwise clear. Bronchovascular markings normal. Pulmonary vascularity normal. No pleural effusions. Degenerative changes involving the thoracic spine. IMPRESSION: 1. Acute pneumonia involving the lingula and LEFT LOWER LOBE. 2. Stable mild cardiomegaly without pulmonary edema. Electronically Signed   By: Hulan Saashomas  Lawrence M.D.   On: 03/23/2020 16:19    Procedures Procedures (including critical care time)  Medications Ordered in UC Medications - No data to display  Initial Impression / Assessment and Plan / UC Course  I have reviewed the triage  vital signs and the  nursing notes.  Pertinent labs & imaging results that were available during my care of the patient were reviewed by me and considered in my medical decision making (see chart for details).    Reviewed chest x-ray findings with patient. Discussed she appears to have left lower lobe pneumonia. Also has mild cardiomegaly which is stable from previous x-ray. Discussed treatment of pneumonia- will treat for possible bacterial infection but may be viral. Start Doxycycline 100mg  twice a day as directed. Discussed previous reaction to PCN - concern over adding Augmentin or a Cephalosporin which is recommended in guidelines for treatment of community acquired pneumonia. Will need close monitoring since only providing one antibiotic for treatment. May start Prednisone 40mg  daily for 5 days to help with wheezing and cough. Continue to monitor blood pressure since Prednisone can increase BP. May trial Combivent every 6 hours as needed for wheezing instead of Albuterol. Sent all Rx to wrong pharmacy so had to resend to in [place] instead of [facility]. Continue to push fluids to stay hydrated and to help loosen up mucus in chest. Rest. Note written for work. Follow-up pending COVID 19 test results and with her PCP in 2 to 3 days for recheck.  Final Clinical Impressions(s) / UC Diagnoses   Final diagnoses:  Pneumonia of left lower lobe due to infectious organism  Cough  Acute bilateral thoracic back pain  Wheezing     Discharge Instructions     Recommend start Doxycycline 100mg  twice a day as directed. Start Prednisone 40mg  daily for 5 days then stop. May use Combivent inhaler 1 puff every 6 hours as needed for wheezing. Continue to push fluids to help loosen up mucus in chest. Rest. Follow-up pending COVID 19 test result and with your PCP in 2 to 3 days for recheck.     ED Prescriptions    Medication Sig Dispense Auth. Provider   doxycycline (VIBRAMYCIN) 100 MG capsule  (Status:  Discontinued) Take 1 capsule (100 mg total) by mouth 2 (two) times daily. 20 capsule [place], NP   predniSONE (DELTASONE) 20 MG tablet  (Status: Discontinued) Take 2 tablets (40 mg total) by mouth daily for 5 days. 10 tablet [employer], NP   Ipratropium-Albuterol (COMBIVENT) 20-100 MCG/ACT AERS respimat  (Status: Discontinued) Inhale 1 puff into the lungs every 6 (six) hours. As needed for wheezing 4 g , NP   doxycycline (VIBRAMYCIN) 100 MG capsule Take 1 capsule (100 mg total) by mouth 2 (two) times daily. 20 capsule , NP   Ipratropium-Albuterol (COMBIVENT) 20-100 MCG/ACT AERS respimat Inhale 1 puff into the lungs every 6 (six) hours. As needed for wheezing 4 g Sudie Grumbling, NP   predniSONE (DELTASONE) 20 MG tablet Take 2 tablets (40 mg total) by mouth daily for 5 days. 10 tablet [place], Sudie Grumbling, NP     PDMP not reviewed this encounter.   Sudie Grumbling, NP 03/24/20 1656

## 2020-05-14 ENCOUNTER — Ambulatory Visit
Admission: EM | Admit: 2020-05-14 | Discharge: 2020-05-14 | Disposition: A | Discharge status: Home / Self Care | Payer: No Typology Code available for payment source | Attending: Family Medicine | Admitting: Family Medicine

## 2020-05-14 ENCOUNTER — Other Ambulatory Visit: Payer: Self-pay

## 2020-05-14 DIAGNOSIS — R519 Headache, unspecified: Secondary | ICD-10-CM | POA: Insufficient documentation

## 2020-05-14 DIAGNOSIS — R52 Pain, unspecified: Secondary | ICD-10-CM | POA: Insufficient documentation

## 2020-05-14 DIAGNOSIS — Z1152 Encounter for screening for COVID-19: Secondary | ICD-10-CM | POA: Diagnosis present

## 2020-05-14 DIAGNOSIS — J029 Acute pharyngitis, unspecified: Secondary | ICD-10-CM | POA: Insufficient documentation

## 2020-05-14 DIAGNOSIS — J039 Acute tonsillitis, unspecified: Secondary | ICD-10-CM | POA: Diagnosis not present

## 2020-05-14 DIAGNOSIS — R591 Generalized enlarged lymph nodes: Secondary | ICD-10-CM | POA: Diagnosis present

## 2020-05-14 LAB — RESP PANEL BY RT PCR (RSV, FLU A&B, COVID)
Influenza A by PCR: NEGATIVE
Influenza B by PCR: NEGATIVE
Respiratory Syncytial Virus by PCR: NEGATIVE
SARS Coronavirus 2 by RT PCR: NEGATIVE

## 2020-05-14 LAB — GROUP A STREP BY PCR: Group A Strep by PCR: NOT DETECTED

## 2020-05-14 MED ORDER — AZITHROMYCIN 250 MG PO TABS: 250.0000 mg | ORAL_TABLET | Freq: Every day | ORAL | 0 refills | Status: DC

## 2020-05-14 NOTE — Discharge Instructions (Addendum)
I have sent in azithromycin for you to take. Take 2 tablets today, then one tablet daily for the next 4 days.  I am treating you for tonsillitis today.  Your COVID/FLU/RSV test is pending.  You should self quarantine until the test result is back.    Take Tylenol as needed for fever or discomfort.  Rest and keep yourself hydrated.    Go to the emergency department if you develop acute worsening symptoms.

## 2020-05-14 NOTE — ED Triage Notes (Signed)
Patient complains of sore throat that started 3 days ago. States that she has also noticed bilateral neck gland swelling.

## 2020-05-14 NOTE — ED Provider Notes (Signed)
[facility]   [id number] 05/14/20 Arrival Time: 6734  LP:FXTK THROAT  SUBJECTIVE: History from: patient.  Kayla Christensen is a 54 y.o. female who presents with abrupt onset of sore throat, chills, body aches, headache, fatigue for 3 days. Also reports that she has bilateral cervical lymphadenopathy. Has taken OTC pain relievers with no benefit. Reports that a colleague has a child with strep and Covid. Has positive history of Covid. Has since completed Covid vaccines. Symptoms are made worse with swallowing, but tolerating liquids and own secretions without difficulty.  Denies previous symptoms in the past.     Denies fever, ear pain, sinus pain, rhinorrhea, nasal congestion, cough, SOB, wheezing, chest pain, nausea, rash, changes in bowel or bladder habits.     ROS: As per HPI.  All other pertinent ROS negative.     Past Medical History:  Diagnosis Date  . History of kidney stones   . Hypertension   . Morbid obesity (HCC)    Past Surgical History:  Procedure Laterality Date  . CHOLECYSTECTOMY    . THYROIDECTOMY    . TUBAL LIGATION     Allergies  Allergen Reactions  . Amlodipine Other (See Comments)    Gingival hyperplasia Gingival hyperplasia   . Doxycycline Diarrhea  . Lisinopril Swelling  . Penicillins    No current facility-administered medications on file prior to encounter.   Current Outpatient Medications on File Prior to Encounter  Medication Sig Dispense Refill  . atorvastatin (LIPITOR) 40 MG tablet Take 40 mg by mouth daily.    Marland Kitchen azelastine (OPTIVAR) 0.05 % ophthalmic solution Place 1 drop into the left eye 2 (two) times daily. 6 mL 12  . carvedilol (COREG) 25 MG tablet Take 25 mg by mouth 2 (two) times daily with a meal.    . hydrALAZINE (APRESOLINE) 25 MG tablet Take 25 mg by mouth 2 (two) times daily.    . Ipratropium-Albuterol (COMBIVENT) 20-100 MCG/ACT AERS respimat Inhale 1 puff into the lungs every 6 (six) hours. As needed for wheezing  4 g 0  . levothyroxine (SYNTHROID, LEVOTHROID) 100 MCG tablet Take 1 tablet (100 mcg total) by mouth every morning. 90 tablet 0  . loratadine (CLARITIN) 10 MG tablet Take 1 tablet (10 mg total) by mouth daily. 90 tablet 2  . olmesartan (BENICAR) 40 MG tablet Take 40 mg by mouth daily.    Marland Kitchen spironolactone (ALDACTONE) 25 MG tablet Take by mouth.    . ciprofloxacin (CILOXAN) 0.3 % ophthalmic solution Administer 1 drop, every 2 hours, while awake, for 2 days. Then 1 drop, every 4 hours, while awake, for the next 5 days. 10 mL 0  . ondansetron (ZOFRAN ODT) 4 MG disintegrating tablet Allow 1-2 tablets to dissolve in your mouth every 8 hours as needed for nausea/vomiting 30 tablet 0  . [DISCONTINUED] amLODipine (NORVASC) 5 MG tablet Take 1 tablet (5 mg total) by mouth daily. 30 tablet 0  . [DISCONTINUED] famotidine (PEPCID) 20 MG tablet Take 1 tablet (20 mg total) by mouth 2 (two) times daily for 10 days. 20 tablet 0  . [DISCONTINUED] lisinopril-hydrochlorothiazide (PRINZIDE,ZESTORETIC) 20-12.5 MG tablet Take 1 tablet by mouth daily.    . [DISCONTINUED] potassium chloride (KLOR-CON) 20 MEQ packet Take 20 mEq by mouth daily. 20 packet 0   Social History   Socioeconomic History  . Marital status: Widowed    Spouse name: Not on file  . Number of children: Not on file  . Years of education: Not on file  .  Highest education level: Not on file  Occupational History  . Not on file  Tobacco Use  . Smoking status: Never Smoker  . Smokeless tobacco: Never Used  Vaping Use  . Vaping Use: Never used  Substance and Sexual Activity  . Alcohol use: No  . Drug use: No  . Sexual activity: Not on file  Other Topics Concern  . Not on file  Social History Narrative  . Not on file   Social Determinants of Health   Financial Resource Strain:   . Difficulty of Paying Living Expenses: Not on file  Food Insecurity:   . Worried About Programme researcher, broadcasting/film/video in the Last Year: Not on file  . Ran Out of Food in  the Last Year: Not on file  Transportation Needs:   . Lack of Transportation (Medical): Not on file  . Lack of Transportation (Non-Medical): Not on file  Physical Activity:   . Days of Exercise per Week: Not on file  . Minutes of Exercise per Session: Not on file  Stress:   . Feeling of Stress : Not on file  Social Connections:   . Frequency of Communication with Friends and Family: Not on file  . Frequency of Social Gatherings with Friends and Family: Not on file  . Attends Religious Services: Not on file  . Active Member of Clubs or Organizations: Not on file  . Attends Banker Meetings: Not on file  . Marital Status: Not on file  Intimate Partner Violence:   . Fear of Current or Ex-Partner: Not on file  . Emotionally Abused: Not on file  . Physically Abused: Not on file  . Sexually Abused: Not on file   History reviewed. No pertinent family history.  OBJECTIVE:  Vitals:   05/14/20 0919 05/14/20 0922  BP:  (!) 170/93  Pulse:  (!) 56  Resp:  18  Temp:  97.8 F (36.6 C)  TempSrc:  Oral  SpO2:  100%  Weight: 245 lb (111.1 kg)   Height: 5\' 9"  (1.753 m)      General appearance: alert; appears fatigued, but nontoxic, speaking in full sentences and managing own secretions HEENT: NCAT; Ears: EACs clear, TMs pearly gray with visible cone of light, without erythema; Eyes: PERRL, EOMI grossly; Nose: no obvious rhinorrhea; Throat: oropharynx erythematous with petechiae, tonsils 2+ and mildly erythematous with white tonsillar exudates, uvula midline Neck: supple with bilateral LAD Lungs: CTA bilaterally without adventitious breath sounds; cough absent Heart: regular rate and rhythm.  Radial pulses 2+ symmetrical bilaterally Skin: warm and dry Psychological: alert and cooperative; normal mood and affect  LABS: Results for orders placed or performed during the hospital encounter of 05/14/20 (from the past 24 hour(s))  Group A Strep by PCR     Status: None    Collection Time: 05/14/20  9:23 AM   Specimen: Throat; Sterile Swab  Result Value Ref Range   Group A Strep by PCR NOT DETECTED NOT DETECTED     ASSESSMENT & PLAN:  1. Acute tonsillitis, unspecified etiology   2. Pharyngitis, unspecified etiology   3. Lymphadenopathy   4. Encounter for screening for COVID-19   5. Aches   6. Nonintractable headache, unspecified chronicity pattern, unspecified headache type     Meds ordered this encounter  Medications  . azithromycin (ZITHROMAX) 250 MG tablet    Sig: Take 1 tablet (250 mg total) by mouth daily. Take first 2 tablets together, then 1 every day until finished.  Dispense:  6 tablet    Refill:  0    Order Specific Question:   Supervising Provider    Answer:   Merrilee Jansky [[id number]]   Strep PCR negative today Will treat for tonsillitis with azithromycin given clinical presentation May have viral component as well, will swab for Covid/flu/RSV Declines test for mono at this time Get plenty of rest and push fluids Take OTC Zyrtec and use chloraseptic spray as needed for throat pain. Drink warm or cool liquids, use throat lozenges, or popsicles to help alleviate symptoms Take OTC ibuprofen or tylenol as needed for pain  Covid/flu/RSV swab obtained in office today.  Patient instructed to quarantine until results are back and negative.  If results are negative, patient may resume daily schedule as tolerated once they are fever free for 24 hours without the use of antipyretic medications.  If results are positive, patient instructed to quarantine 10 days from today.  Patient instructed to follow-up with primary care with this office as needed.  Patient instructed to follow-up in the ER for trouble swallowing, trouble breathing, other concerning symptoms.  Reviewed expectations re: course of current medical issues. Questions answered. Outlined signs and symptoms indicating need for more acute intervention. Patient verbalized  understanding. After Visit Summary given.          Moshe Cipro, NP 05/14/20 1002

## 2021-03-06 ENCOUNTER — Other Ambulatory Visit: Payer: Self-pay

## 2021-03-06 ENCOUNTER — Other Ambulatory Visit: Payer: Self-pay | Admitting: Family Medicine

## 2021-03-06 ENCOUNTER — Ambulatory Visit
Admission: RE | Admit: 2021-03-06 | Discharge: 2021-03-06 | Disposition: A | Discharge status: Home / Self Care | Payer: No Typology Code available for payment source | Attending: Family Medicine | Admitting: Family Medicine

## 2021-03-06 ENCOUNTER — Ambulatory Visit
Admission: RE | Admit: 2021-03-06 | Discharge: 2021-03-06 | Disposition: A | Discharge status: Home / Self Care | Payer: No Typology Code available for payment source | Source: Ambulatory Visit | Attending: Family Medicine | Admitting: Family Medicine

## 2021-03-06 DIAGNOSIS — M25562 Pain in left knee: Secondary | ICD-10-CM

## 2021-03-06 DIAGNOSIS — M25532 Pain in left wrist: Secondary | ICD-10-CM | POA: Insufficient documentation

## 2021-03-06 DIAGNOSIS — M545 Low back pain, unspecified: Secondary | ICD-10-CM

## 2021-05-31 ENCOUNTER — Other Ambulatory Visit: Payer: Self-pay

## 2021-05-31 ENCOUNTER — Ambulatory Visit
Admission: EM | Admit: 2021-05-31 | Discharge: 2021-05-31 | Disposition: A | Discharge status: Home / Self Care | Payer: No Typology Code available for payment source | Attending: Emergency Medicine | Admitting: Emergency Medicine

## 2021-05-31 DIAGNOSIS — M5441 Lumbago with sciatica, right side: Secondary | ICD-10-CM

## 2021-05-31 DIAGNOSIS — G8929 Other chronic pain: Secondary | ICD-10-CM

## 2021-05-31 MED ORDER — PREDNISONE 10 MG (21) PO TBPK: ORAL_TABLET | Freq: Every day | ORAL | 0 refills | Status: DC

## 2021-05-31 MED ORDER — KETOROLAC TROMETHAMINE 60 MG/2ML IM SOLN
30.0000 mg | Freq: Once | INTRAMUSCULAR | Status: AC
  Administered 2021-05-31: 30 mg via INTRAMUSCULAR

## 2021-05-31 MED ORDER — METHYLPREDNISOLONE SODIUM SUCC 125 MG IJ SOLR
125.0000 mg | Freq: Once | INTRAMUSCULAR | Status: AC
  Administered 2021-05-31: 125 mg via INTRAMUSCULAR

## 2021-05-31 NOTE — ED Provider Notes (Signed)
[facility]    CSN: [id number] Arrival date & time: 05/31/21  0949      History   Chief Complaint Chief Complaint  Patient presents with   Back Pain    HPI Kayla Christensen is a 55 y.o. female.   Pt has chronic back pain from a injury 8/22 pt states that she was at work yesterday on light duty and began to have pain when getting up that radiates down rt leg. Pt took muscle relaxer that she has at home with no relief. Denies any new injury. Does see PT and has an appiont this week. No loss of urine.    Past Medical History:  Diagnosis Date   History of kidney stones    Hypertension    Morbid obesity [facility])     Patient Active Problem List   Diagnosis Date Noted   Sepsis due to pneumonia (HCC) 09/09/2018    Past Surgical History:  Procedure Laterality Date   CHOLECYSTECTOMY     THYROIDECTOMY     TUBAL LIGATION      OB History   No obstetric history on file.      Home Medications    Prior to Admission medications   Medication Sig Start Date End Date Taking? Authorizing Provider  atorvastatin (LIPITOR) 40 MG tablet Take 40 mg by mouth daily. 03/22/18  Yes [provider]  carvedilol (COREG) 25 MG tablet Take 25 mg by mouth 2 (two) times daily with a meal. 03/22/18  Yes [provider]  hydrALAZINE (APRESOLINE) 25 MG tablet Take 25 mg by mouth 2 (two) times daily. 04/13/19  Yes [provider]  Ipratropium-Albuterol (COMBIVENT) 20-100 MCG/ACT AERS respimat Inhale 1 puff into the lungs every 6 (six) hours. As needed for wheezing 03/23/20  Yes Amyot, Ali Lowe, NP  levothyroxine (SYNTHROID, LEVOTHROID) 100 MCG tablet Take 1 tablet (100 mcg total) by mouth every morning. 09/13/18  Yes Salary, Evelena Asa, MD  loratadine (CLARITIN) 10 MG tablet Take 1 tablet (10 mg total) by mouth daily. 09/12/18  Yes Salary, Evelena Asa, MD  olmesartan (BENICAR) 40 MG tablet Take 40 mg by mouth daily. 03/24/19  Yes [provider]  ondansetron  (ZOFRAN ODT) 4 MG disintegrating tablet Allow 1-2 tablets to dissolve in your mouth every 8 hours as needed for nausea/vomiting 03/26/19  Yes Loleta Rose, MD  predniSONE (STERAPRED UNI-PAK 21 TAB) 10 MG (21) TBPK tablet Take by mouth daily. Take 6 tabs by mouth daily  for 2 days, then 5 tabs for 2 days, then 4 tabs for 2 days, then 3 tabs for 2 days, 2 tabs for 2 days, then 1 tab by mouth daily for 2 days 05/31/21  Yes Coralyn Mark, NP  spironolactone (ALDACTONE) 25 MG tablet Take by mouth. 03/16/19 05/31/21 Yes [provider]  azelastine (OPTIVAR) 0.05 % ophthalmic solution Place 1 drop into the left eye 2 (two) times daily. 04/23/19   Tommie Sams, DO  azithromycin (ZITHROMAX) 250 MG tablet Take 1 tablet (250 mg total) by mouth daily. Take first 2 tablets together, then 1 every day until finished. 05/14/20   Moshe Cipro, NP  ciprofloxacin (CILOXAN) 0.3 % ophthalmic solution Administer 1 drop, every 2 hours, while awake, for 2 days. Then 1 drop, every 4 hours, while awake, for the next 5 days. 04/23/19   Tommie Sams, DO  amLODipine (NORVASC) 5 MG tablet Take 1 tablet (5 mg total) by mouth daily. 09/12/18 03/15/19  Salary, Jetty Duhamel  D, MD  famotidine (PEPCID) 20 MG tablet Take 1 tablet (20 mg total) by mouth 2 (two) times daily for 10 days. 03/15/19 04/23/19  Menshew, Charlesetta Ivory, PA-C  lisinopril-hydrochlorothiazide (PRINZIDE,ZESTORETIC) 20-12.5 MG tablet Take 1 tablet by mouth daily. 03/22/18 04/23/19  [provider]  potassium chloride (KLOR-CON) 20 MEQ packet Take 20 mEq by mouth daily. 09/12/18 03/15/19  Salary, Evelena Asa, MD    Family History History reviewed. No pertinent family history.  Social History Social History   Tobacco Use   Smoking status: Never   Smokeless tobacco: Never  Vaping Use   Vaping Use: Never used  Substance Use Topics   Alcohol use: No   Drug use: No     Allergies   Amlodipine, Doxycycline, Lisinopril, and Penicillins   Review of  Systems Review of Systems  Constitutional:  Negative for fever.  Respiratory: Negative.    Cardiovascular: Negative.   Gastrointestinal: Negative.   Genitourinary: Negative.   Musculoskeletal:  Positive for back pain and joint swelling.  Skin: Negative.   Neurological:        Numbness and tingle down rt leg. Lower back and rt side pain     Physical Exam Triage Vital Signs ED Triage Vitals  Enc Vitals Group     BP 05/31/21 1029 (!) 155/93     Pulse Rate 05/31/21 1029 70     Resp 05/31/21 1029 18     Temp 05/31/21 1029 98.6 F (37 C)     Temp Source 05/31/21 1029 Oral     SpO2 05/31/21 1029 98 %     Weight 05/31/21 1027 240 lb (108.9 kg)     Height 05/31/21 1027 5\' 11"  (1.803 m)     Head Circumference --      Peak Flow --      Pain Score 05/31/21 1027 10     Pain Loc --      Pain Edu? --      Excl. in GC? --    No data found.  Updated Vital Signs BP (!) 155/93 (BP Location: Right Arm)   Pulse 70   Temp 98.6 F (37 C) (Oral)   Resp 18   Ht 5\' 11"  (1.803 m)   Wt 240 lb (108.9 kg)   SpO2 98%   BMI 33.47 kg/m   Visual Acuity Right Eye Distance:   Left Eye Distance:   Bilateral Distance:    Right Eye Near:   Left Eye Near:    Bilateral Near:     Physical Exam Constitutional:      Appearance: Normal appearance.  Cardiovascular:     Rate and Rhythm: Normal rate.  Pulmonary:     Effort: Pulmonary effort is normal.  Abdominal:     General: Abdomen is flat.  Musculoskeletal:        General: Tenderness present. No signs of injury.     Comments: Decrease flexion and to bear wt due to pain to rt side of leg. Lt knee wears a brace previously. Slight edema to bil lower ankles.   Skin:    General: Skin is warm.     Capillary Refill: Capillary refill takes less than 2 seconds.  Neurological:     General: No focal deficit present.     Mental Status: She is alert.     UC Treatments / Results  Labs (all labs ordered are listed, but only abnormal results are  displayed) Labs Reviewed - No data to display  EKG  Radiology No results found.  Procedures Procedures (including critical care time)  Medications Ordered in UC Medications  methylPREDNISolone sodium succinate (SOLU-MEDROL) 125 mg/2 mL injection 125 mg (has no administration in time range)  ketorolac (TORADOL) injection 30 mg (has no administration in time range)    Initial Impression / Assessment and Plan / UC Course  I have reviewed the triage vital signs and the nursing notes.  Pertinent labs & imaging results that were available during my care of the patient were reviewed by me and considered in my medical decision making (see chart for details).     Take meds as needed  Will need to keep your appoint with pt and inform them of the situation Take nsaids or tylenol as needed for pain   Final Clinical Impressions(s) / UC Diagnoses   Final diagnoses:  Chronic right-sided low back pain with right-sided sciatica   Discharge Instructions   None    ED Prescriptions     Medication Sig Dispense Auth. Provider   predniSONE (STERAPRED UNI-PAK 21 TAB) 10 MG (21) TBPK tablet Take by mouth daily. Take 6 tabs by mouth daily  for 2 days, then 5 tabs for 2 days, then 4 tabs for 2 days, then 3 tabs for 2 days, 2 tabs for 2 days, then 1 tab by mouth daily for 2 days 42 tablet Coralyn Mark, NP      PDMP not reviewed this encounter.   Coralyn Mark, NP 05/31/21 1125

## 2021-05-31 NOTE — ED Triage Notes (Signed)
Pt here with C/O right lower back pain going down right leg and hip

## 2021-07-31 ENCOUNTER — Ambulatory Visit: Payer: No Typology Code available for payment source

## 2021-09-08 ENCOUNTER — Other Ambulatory Visit: Payer: Self-pay

## 2021-09-08 ENCOUNTER — Emergency Department
Admission: EM | Admit: 2021-09-08 | Discharge: 2021-09-09 | Disposition: A | Discharge status: Home / Self Care | Payer: No Typology Code available for payment source | Attending: Emergency Medicine | Admitting: Emergency Medicine

## 2021-09-08 ENCOUNTER — Encounter: Payer: Self-pay | Admitting: *Deleted

## 2021-09-08 DIAGNOSIS — I10 Essential (primary) hypertension: Secondary | ICD-10-CM | POA: Insufficient documentation

## 2021-09-08 DIAGNOSIS — M5431 Sciatica, right side: Secondary | ICD-10-CM

## 2021-09-08 DIAGNOSIS — M545 Low back pain, unspecified: Secondary | ICD-10-CM | POA: Diagnosis present

## 2021-09-08 DIAGNOSIS — M5441 Lumbago with sciatica, right side: Secondary | ICD-10-CM | POA: Diagnosis not present

## 2021-09-08 MED ORDER — PREDNISONE 20 MG PO TABS
60.0000 mg | ORAL_TABLET | Freq: Once | ORAL | Status: AC
  Administered 2021-09-08: 60 mg via ORAL
  Filled 2021-09-08: qty 3

## 2021-09-08 MED ORDER — KETOROLAC TROMETHAMINE 30 MG/ML IJ SOLN
15.0000 mg | INTRAMUSCULAR | Status: AC
  Administered 2021-09-08: 15 mg via INTRAMUSCULAR
  Filled 2021-09-08: qty 1

## 2021-09-08 MED ORDER — METHOCARBAMOL 500 MG PO TABS
500.0000 mg | ORAL_TABLET | Freq: Once | ORAL | Status: AC
  Administered 2021-09-09: 500 mg via ORAL
  Filled 2021-09-08 (×2): qty 1

## 2021-09-08 MED ORDER — NAPROXEN 500 MG PO TABS: 500.0000 mg | ORAL_TABLET | Freq: Two times a day (BID) | ORAL | 0 refills | Status: DC

## 2021-09-08 MED ORDER — PREDNISONE 10 MG (21) PO TBPK: ORAL_TABLET | ORAL | 0 refills | Status: DC

## 2021-09-08 MED ORDER — METHOCARBAMOL 500 MG PO TABS: 500.0000 mg | ORAL_TABLET | Freq: Four times a day (QID) | ORAL | 0 refills | Status: AC | PRN

## 2021-09-08 NOTE — ED Notes (Signed)
Pt to room from [facility] via WC. Pt moved independently from [facility] to stretcher.

## 2021-09-08 NOTE — ED Triage Notes (Addendum)
Pt has lower back pain since yesterday. States pain radiates into legs.  No known injury  pt took tylenol without relief.  Pt alert.   Speech clear.  Pt took bp meds this am   no headache.

## 2021-09-08 NOTE — ED Notes (Signed)
Pt c/o b/l lower back/leg pain, onset yesterday. She states she thinks this is her sciatic pain flaring up. Pt took OTC ibuprofen without relief, last dose approx 1500 today.

## 2021-09-08 NOTE — ED Provider Notes (Signed)
[facility] Provider Note    Event Date/Time   First MD Initiated Contact with Patient 09/08/21 2258     (approximate)   History   Back Pain   HPI  Kayla Christensen is a 56 y.o. female  With a past history of hypertension, kidney stones, recurrent low back pain with sciatica who comes the ED complaining of low back pain, gradual onset and worsening over the past week.  Waxing and waning.  Worse with movement and changing position.  Radiates down bilateral legs.  Feels just like her usual sciatica syndrome.  She reports that over the past week she has been sitting in a different chair at work which is caused her to change her posture, and this is associated with her worsening back pain.  Denies any motor weakness, paresthesia, bowel or bladder retention or incontinence.  Denies any abdominal pain fever vomiting diarrhea.  No dysuria.  She has taken all of her medicines this morning, but has not taken her evening doses including carvedilol, spironolactone, hydralazine, atorvastatin.     Physical Exam   Triage Vital Signs: ED Triage Vitals  Enc Vitals Group     BP 09/08/21 2005 (!) 112/50     Pulse Rate 09/08/21 2005 67     Resp 09/08/21 2005 18     Temp 09/08/21 2005 98.2 F (36.8 C)     Temp Source 09/08/21 2005 Oral     SpO2 09/08/21 2005 97 %     Weight 09/08/21 2006 260 lb (117.9 kg)     Height 09/08/21 2006 5\' 10"  (1.778 m)     Head Circumference --      Peak Flow --      Pain Score 09/08/21 2005 9     Pain Loc --      Pain Edu? --      Excl. in GC? --     Most recent vital signs: Vitals:   09/08/21 2006 09/08/21 2230  BP: (!) 210/103 (!) 179/82  Pulse:  (!) 58  Resp:  17  Temp:    SpO2:  100%     General: Awake, no distress.  CV:  Good peripheral perfusion.  Resp:  Normal effort.  Abd:  No distention.  Soft and nontender Other:  No midline spinal tenderness.  Normal lower extremity strength.  Normal reflexes.   ED  Results / Procedures / Treatments   Labs (all labs ordered are listed, but only abnormal results are displayed) Labs Reviewed - No data to display   EKG     RADIOLOGY     PROCEDURES:  Critical Care performed: No  Procedures   MEDICATIONS ORDERED IN ED: Medications  methocarbamol (ROBAXIN) tablet 500 mg (has no administration in time range)  ketorolac (TORADOL) 30 MG/ML injection 15 mg (15 mg Intramuscular Given 09/08/21 2334)  predniSONE (DELTASONE) tablet 60 mg (60 mg Oral Given 09/08/21 2337)     IMPRESSION / MDM / ASSESSMENT AND PLAN / ED COURSE  I reviewed the triage vital signs and the nursing notes.                               Patient presents with low back pain consistent with an exacerbation of her established sciatica pain syndrome.  Vital signs were unremarkable except for uncontrolled blood pressure which is attributable to her being overdue for her usual home medications.  No new acute symptoms,  reassuring exam.  I doubt fracture, cord compression, AAA, dissection, epidural mass effect, osteomyelitis or other infectious process.  We will treat with NSAIDs, Robaxin, prednisone taper.  Her son is here with her and can drive home.  She does not require admission due to reassuring exam, neurologically intact.  Stable for discharge.        FINAL CLINICAL IMPRESSION(S) / ED DIAGNOSES   Final diagnoses:  Sciatica of right side  Uncontrolled hypertension     Rx / DC Orders   ED Discharge Orders          Ordered    naproxen (NAPROSYN) 500 MG tablet  2 times daily with meals        09/08/21 2352    predniSONE (STERAPRED UNI-PAK 21 TAB) 10 MG (21) TBPK tablet        09/08/21 2352    methocarbamol (ROBAXIN) 500 MG tablet  Every 6 hours PRN        09/08/21 2352             Note:  This document was prepared using Dragon voice recognition software and may include unintentional dictation errors.   Sharman Cheek, MD 09/08/21 2358

## 2021-12-15 ENCOUNTER — Ambulatory Visit
Admission: EM | Admit: 2021-12-15 | Discharge: 2021-12-15 | Disposition: A | Discharge status: Home / Self Care | Payer: No Typology Code available for payment source | Attending: Physician Assistant | Admitting: Physician Assistant

## 2021-12-15 ENCOUNTER — Telehealth: Payer: Self-pay | Admitting: Internal Medicine

## 2021-12-15 DIAGNOSIS — Z20822 Contact with and (suspected) exposure to covid-19: Secondary | ICD-10-CM | POA: Diagnosis not present

## 2021-12-15 DIAGNOSIS — Z8616 Personal history of COVID-19: Secondary | ICD-10-CM | POA: Diagnosis not present

## 2021-12-15 DIAGNOSIS — B349 Viral infection, unspecified: Secondary | ICD-10-CM

## 2021-12-15 DIAGNOSIS — R110 Nausea: Secondary | ICD-10-CM | POA: Diagnosis not present

## 2021-12-15 DIAGNOSIS — J029 Acute pharyngitis, unspecified: Secondary | ICD-10-CM

## 2021-12-15 DIAGNOSIS — Z6838 Body mass index (BMI) 38.0-38.9, adult: Secondary | ICD-10-CM | POA: Diagnosis not present

## 2021-12-15 DIAGNOSIS — R109 Unspecified abdominal pain: Secondary | ICD-10-CM | POA: Insufficient documentation

## 2021-12-15 DIAGNOSIS — R051 Acute cough: Secondary | ICD-10-CM

## 2021-12-15 DIAGNOSIS — Z209 Contact with and (suspected) exposure to unspecified communicable disease: Secondary | ICD-10-CM | POA: Diagnosis not present

## 2021-12-15 DIAGNOSIS — I10 Essential (primary) hypertension: Secondary | ICD-10-CM

## 2021-12-15 LAB — GROUP A STREP BY PCR: Group A Strep by PCR: NOT DETECTED

## 2021-12-15 MED ORDER — PROMETHAZINE-DM 6.25-15 MG/5ML PO SYRP: 5.0000 mL | ORAL_SOLUTION | Freq: Four times a day (QID) | ORAL | 0 refills | Status: DC | PRN

## 2021-12-15 MED ORDER — LIDOCAINE VISCOUS HCL 2 % MT SOLN: 15.0000 mL | OROMUCOSAL | 0 refills | Status: DC | PRN

## 2021-12-15 MED ORDER — ONDANSETRON HCL 4 MG PO TABS: 4.0000 mg | ORAL_TABLET | Freq: Three times a day (TID) | ORAL | 0 refills | Status: DC | PRN

## 2021-12-15 MED ORDER — BENZONATATE 200 MG PO CAPS: 200.0000 mg | ORAL_CAPSULE | Freq: Three times a day (TID) | ORAL | 0 refills | Status: DC | PRN

## 2021-12-15 MED ORDER — IPRATROPIUM BROMIDE 0.06 % NA SOLN: 2.0000 | Freq: Four times a day (QID) | NASAL | 0 refills | Status: DC

## 2021-12-15 NOTE — ED Provider Notes (Signed)
[facility]    CSN: [id number] Arrival date & time: 12/15/21  1621      History   Chief Complaint Chief Complaint  Patient presents with   Sore Throat   Cough   Generalized Body Aches    HPI Kayla Christensen is a 56 y.o. female presenting for 2-day history of low-grade fever, fatigue, bilateral ear pain, sinus pressure, cough, sore throat, body aches, chills, nausea/vomiting and diarrhea.  Patient works as a Engineer, structural in assisted living facility and reports that multiple coworkers have been sick with similar symptoms.  She denies any associated chest pain or shortness of breath.  Mild abdominal cramping pains.  Patient says "I have had COVID 5 times."  She says the last time she had COVID was a year ago and she does not feel like she has it now but would like to be tested She has been taking Alka-Seltzer but says it has not helped and she thinks that is the reason why her blood pressure is increased.  BP is 184/91 initially.  History of hypertension.  Patient takes multiple antihypertensives but says she normally takes them at night because they make her tired.  Other medical history significant for obesity.  No other complaints.  HPI  Past Medical History:  Diagnosis Date   History of kidney stones    Hypertension    Morbid obesity [facility])     Patient Active Problem List   Diagnosis Date Noted   Sepsis due to pneumonia (HCC) 09/09/2018    Past Surgical History:  Procedure Laterality Date   CHOLECYSTECTOMY     THYROIDECTOMY     TUBAL LIGATION      OB History   No obstetric history on file.      Home Medications    Prior to Admission medications   Medication Sig Start Date End Date Taking? Authorizing Provider  benzonatate (TESSALON) 200 MG capsule Take 1 capsule (200 mg total) by mouth 3 (three) times daily as needed for cough. 12/15/21  Yes Eusebio Friendly B, PA-C  ipratropium (ATROVENT) 0.06 % nasal spray Place 2 sprays into both nostrils 4 (four)  times daily. 12/15/21  Yes Eusebio Friendly B, PA-C  lidocaine (XYLOCAINE) 2 % solution Use as directed 15 mLs in the mouth or throat every 3 (three) hours as needed for mouth pain (swish and spit). 12/15/21  Yes Eusebio Friendly B, PA-C  ondansetron (ZOFRAN) 4 MG tablet Take 1 tablet (4 mg total) by mouth every 8 (eight) hours as needed for nausea or vomiting. 12/15/21  Yes Shirlee Latch, PA-C  promethazine-dextromethorphan (PROMETHAZINE-DM) 6.25-15 MG/5ML syrup Take 5 mLs by mouth 4 (four) times daily as needed. 12/15/21  Yes Eusebio Friendly B, PA-C  atorvastatin (LIPITOR) 40 MG tablet Take 40 mg by mouth daily. 03/22/18   [provider]  carvedilol (COREG) 25 MG tablet Take 25 mg by mouth 2 (two) times daily with a meal. 03/22/18   [provider]  hydrALAZINE (APRESOLINE) 25 MG tablet Take 25 mg by mouth 2 (two) times daily. 04/13/19   [provider]  Ipratropium-Albuterol (COMBIVENT) 20-100 MCG/ACT AERS respimat Inhale 1 puff into the lungs every 6 (six) hours. As needed for wheezing 03/23/20   Sudie Grumbling, NP  levothyroxine (SYNTHROID, LEVOTHROID) 100 MCG tablet Take 1 tablet (100 mcg total) by mouth every morning. 09/13/18   Salary, Evelena Asa, MD  loratadine (CLARITIN) 10 MG tablet Take 1 tablet (10 mg total) by mouth daily. 09/12/18  Salary, Evelena Asa, MD  olmesartan (BENICAR) 40 MG tablet Take 40 mg by mouth daily. 03/24/19   [provider]  spironolactone (ALDACTONE) 25 MG tablet Take by mouth. 03/16/19 05/31/21  [provider]  amLODipine (NORVASC) 5 MG tablet Take 1 tablet (5 mg total) by mouth daily. 09/12/18 03/15/19  Salary, Evelena Asa, MD  famotidine (PEPCID) 20 MG tablet Take 1 tablet (20 mg total) by mouth 2 (two) times daily for 10 days. 03/15/19 04/23/19  Menshew, Charlesetta Ivory, PA-C  lisinopril-hydrochlorothiazide (PRINZIDE,ZESTORETIC) 20-12.5 MG tablet Take 1 tablet by mouth daily. 03/22/18 04/23/19  [provider]  potassium chloride (KLOR-CON)  20 MEQ packet Take 20 mEq by mouth daily. 09/12/18 03/15/19  Salary, Evelena Asa, MD    Family History History reviewed. No pertinent family history.  Social History Social History   Tobacco Use   Smoking status: Never   Smokeless tobacco: Never  Vaping Use   Vaping Use: Never used  Substance Use Topics   Alcohol use: No   Drug use: No     Allergies   Amlodipine, Doxycycline, Lisinopril, and Penicillins   Review of Systems Review of Systems  Constitutional:  Positive for chills, fatigue and fever. Negative for diaphoresis.  HENT:  Positive for congestion, ear pain, rhinorrhea, sinus pressure and sore throat.   Respiratory:  Positive for cough. Negative for shortness of breath.   Cardiovascular:  Negative for chest pain.  Gastrointestinal:  Positive for abdominal pain, diarrhea, nausea and vomiting.  Musculoskeletal:  Positive for myalgias. Negative for arthralgias.  Skin:  Negative for rash.  Neurological:  Positive for headaches. Negative for weakness.  Hematological:  Negative for adenopathy.    Physical Exam Triage Vital Signs ED Triage Vitals  Enc Vitals Group     BP --      Pulse --      Resp --      Temp --      Temp src --      SpO2 --      Weight 12/15/21 1755 264 lb (119.7 kg)     Height 12/15/21 1755  (1.753 m)     Head Circumference --      Peak Flow --      Pain Score 12/15/21 1754 7     Pain Loc --      Pain Edu? --      Excl. in [place]? --    No data found.  Updated Vital Signs BP (!) 190/95 (BP Location: Left Arm)   Pulse 81   Temp 100.1 F (37.8 C) (Oral)   Resp 18   Ht  (1.753 m)   Wt 264 lb (119.7 kg)   SpO2 99%   BMI 38.99 kg/m      Physical Exam Vitals and nursing note reviewed.  Constitutional:      General: She is not in acute distress.    Appearance: Normal appearance. She is well-developed. She is obese. She is ill-appearing. She is not toxic-appearing.  HENT:     Head: Normocephalic and atraumatic.     Right Ear:  Tympanic membrane, ear canal and external ear normal.     Left Ear: Tympanic membrane, ear canal and external ear normal.     Nose: Congestion present.     Mouth/Throat:     Mouth: Mucous membranes are moist.     Pharynx: Oropharynx is clear. Posterior oropharyngeal erythema present.  Eyes:     General: No scleral icterus.  Right eye: No discharge.        Left eye: No discharge.     Conjunctiva/sclera: Conjunctivae normal.  Cardiovascular:     Rate and Rhythm: Normal rate and regular rhythm.     Heart sounds: Normal heart sounds.  Pulmonary:     Effort: Pulmonary effort is normal. No respiratory distress.     Breath sounds: Normal breath sounds. No wheezing, rhonchi or rales.  Abdominal:     Palpations: Abdomen is soft.     Tenderness: There is abdominal tenderness. Rebound: generalized, mild.  Musculoskeletal:     Cervical back: Neck supple.  Skin:    General: Skin is dry.  Neurological:     General: No focal deficit present.     Mental Status: She is alert. Mental status is at baseline.     Motor: No weakness.     Gait: Gait normal.  Psychiatric:        Mood and Affect: Mood normal.        Behavior: Behavior normal.        Thought Content: Thought content normal.     UC Treatments / Results  Labs (all labs ordered are listed, but only abnormal results are displayed) Labs Reviewed  GROUP A STREP BY PCR  SARS CORONAVIRUS 2 BY RT PCR    EKG   Radiology No results found.  Procedures Procedures (including critical care time)  Medications Ordered in UC Medications - No data to display  Initial Impression / Assessment and Plan / UC Course  I have reviewed the triage vital signs and the nursing notes.  Pertinent labs & imaging results that were available during my care of the patient were reviewed by me and considered in my medical decision making (see chart for details).  56 year old female presenting for 2-day history of low-grade fever, fatigue, body  aches, chills, headache, sinus pressure, nasal congestion, sore throat, cough, abdominal cramping, nausea/vomiting and diarrhea.  BP 184/91.  Temp currently 100.1 degrees.  Patient ill-appearing but nontoxic.  Complaining of chills.  Exam significant for nasal congestion and erythema posterior pharynx.  Chest clear to auscultation heart regular rate rhythm.  Abdomen soft with generalized tenderness.  PCR strep test obtained as well as PCR COVID testing.  Negative strep.  Pending COVID test.  Discussed lab result with patient.  COVID-positive will send antiviral medicine.  Reviewed current CDC guidelines, isolation protocol and ED precautions if positive.  Advised if negative, likely another viral illness.  Advised patient symptoms consistent with viral URI.  Supportive care encouraged with increasing rest and fluids.  Sent Promethazine DM to pharmacy as well as benzonatate, Atrovent nasal spray, viscous lidocaine and Zofran.  Reviewed return to ER precautions and provided with a work note.  We also discussed her significantly elevated blood pressure.  Encouraged her to keep a log and follow-up with PCP especially if it continues to be this high.  To ER if associated headaches, vision changes, chest pain, breathing occultly, palpitations or weakness.   Final Clinical Impressions(s) / UC Diagnoses   Final diagnoses:  Viral illness  Sore throat  Acute cough  Nausea without vomiting  Essential hypertension     Discharge Instructions      -Strep test was negative today -COVID test will be back tomorrow.  I am really sorry there was an issue running the test at the clinic so we had to send her to the hospital.  I will contact you as soon as the results come in.  If you are positive I will send antiviral medicine.  You need to isolate 5 days and wear a mask for 5 days.  If it is negative, is likely another virus. -You should feel better within 7-10 days. Increase rest and fluids. Try Tylenol for  body aches and low grade fever.  -I have sent a cough medicine, nasal spray, throat numbing solution to pharmacy -Return if un-resolving fever or worsening cough or if you develop chest pain, shortness of breath, increased fatigue/weakness or are not improving after 1 week. -Go to ER for any sudden or sever acute worsening of your symptoms.  -Your blood pressure is significantly elevated.  Keep a log of her blood pressures and follow-up with your primary care provider especially if they continue to be as high as they are.  Your last blood pressure check was 190/95.  If you ever have any associated chest pain, palpitations, shortness of breath, dizziness or weakness, numbness or tingling, severe headaches, vision changes, swelling in her legs, he should go to the emergency department or call 911.     ED Prescriptions     Medication Sig Dispense Auth. Provider   promethazine-dextromethorphan (PROMETHAZINE-DM) 6.25-15 MG/5ML syrup Take 5 mLs by mouth 4 (four) times daily as needed. 118 mL Eusebio Friendly B, PA-C   benzonatate (TESSALON) 200 MG capsule Take 1 capsule (200 mg total) by mouth 3 (three) times daily as needed for cough. 20 capsule Eusebio Friendly B, PA-C   ipratropium (ATROVENT) 0.06 % nasal spray Place 2 sprays into both nostrils 4 (four) times daily. 15 mL Eusebio Friendly B, PA-C   lidocaine (XYLOCAINE) 2 % solution Use as directed 15 mLs in the mouth or throat every 3 (three) hours as needed for mouth pain (swish and spit). 100 mL Eusebio Friendly B, PA-C   ondansetron (ZOFRAN) 4 MG tablet Take 1 tablet (4 mg total) by mouth every 8 (eight) hours as needed for nausea or vomiting. 12 tablet Gareth Morgan      PDMP not reviewed this encounter.   Shirlee Latch, PA-C 12/16/21 [phone]

## 2021-12-15 NOTE — Telephone Encounter (Signed)
I called pt to inform her that her repeated rapid covid was negative

## 2021-12-15 NOTE — ED Triage Notes (Signed)
Pt c/o bilateral ear pain, cough, sore throat, and body chills x2-3days.  Pt is not worried about covid or flu and believes it to be a virus or a possible ear infection.  Pt has been taking alkaseltzer and states that it is not helping.

## 2021-12-15 NOTE — Discharge Instructions (Addendum)
-  Strep test was negative today -COVID test will be back tomorrow.  I am really sorry there was an issue running the test at the clinic so we had to send her to the hospital.  I will contact you as soon as the results come in.  If you are positive I will send antiviral medicine.  You need to isolate 5 days and wear a mask for 5 days.  If it is negative, is likely another virus. -You should feel better within 7-10 days. Increase rest and fluids. Try Tylenol for body aches and low grade fever.  -I have sent a cough medicine, nasal spray, throat numbing solution to pharmacy -Return if un-resolving fever or worsening cough or if you develop chest pain, shortness of breath, increased fatigue/weakness or are not improving after 1 week. -Go to ER for any sudden or sever acute worsening of your symptoms.  -Your blood pressure is significantly elevated.  Keep a log of her blood pressures and follow-up with your primary care provider especially if they continue to be as high as they are.  Your last blood pressure check was 190/95.  If you ever have any associated chest pain, palpitations, shortness of breath, dizziness or weakness, numbness or tingling, severe headaches, vision changes, swelling in her legs, he should go to the emergency department or call 911.

## 2021-12-16 LAB — SARS CORONAVIRUS 2 BY RT PCR: SARS Coronavirus 2 by RT PCR: NEGATIVE

## 2022-06-13 ENCOUNTER — Encounter: Payer: Self-pay | Admitting: Emergency Medicine

## 2022-06-13 ENCOUNTER — Ambulatory Visit
Admission: EM | Admit: 2022-06-13 | Discharge: 2022-06-13 | Disposition: A | Discharge status: Home / Self Care | Payer: No Typology Code available for payment source | Attending: Internal Medicine | Admitting: Internal Medicine

## 2022-06-13 DIAGNOSIS — B9789 Other viral agents as the cause of diseases classified elsewhere: Secondary | ICD-10-CM | POA: Diagnosis not present

## 2022-06-13 DIAGNOSIS — J329 Chronic sinusitis, unspecified: Secondary | ICD-10-CM | POA: Diagnosis not present

## 2022-06-13 MED ORDER — PREDNISONE 20 MG PO TABS: 20.0000 mg | ORAL_TABLET | Freq: Every day | ORAL | 0 refills | Status: DC

## 2022-06-13 NOTE — ED Provider Notes (Signed)
[facility]    CSN: [id number] Arrival date & time: 06/13/22  1101      History   Chief Complaint Chief Complaint  Patient presents with   Cough   Headache    HPI Kayla Christensen is a 56 y.o. female with history of chronic HTN on multiple medication presents to UC today with complaint of headache, runny nose, itchy ears, cough and chest tightness.  She reports this started 3 days ago.  The headache is located behind her eyes.  She describes the pain as pressure.  She is blowing clear mucus out of her nose.  The cough is mostly nonproductive.  She denies nasal congestion, ear pain, sore throat, shortness of breath, chest pain, nausea, vomiting or diarrhea.  She reports she ran a fever of 104 last night but denies chills or body aches.  She has taken Tylenol sinus and Dimetapp OTC with minimal relief of symptoms.  She has had sick contacts with similar symptoms but not been exposed to [place] or flu that she is aware of.  HPI  Past Medical History:  Diagnosis Date   History of kidney stones    Hypertension    Morbid obesity [facility])     Patient Active Problem List   Diagnosis Date Noted   Sepsis due to pneumonia ([place]) 09/09/2018    Past Surgical History:  Procedure Laterality Date   CHOLECYSTECTOMY     THYROIDECTOMY     TUBAL LIGATION      OB History   No obstetric history on file.      Home Medications    Prior to Admission medications   Medication Sig Start Date End Date Taking? Authorizing Provider  predniSONE (DELTASONE) 20 MG tablet Take 1 tablet (20 mg total) by mouth daily with breakfast. 06/13/22  Yes Pang Robers, Coralie Keens, NP  atorvastatin (LIPITOR) 40 MG tablet Take 40 mg by mouth daily. 03/22/18   [provider]  benzonatate (TESSALON) 200 MG capsule Take 1 capsule (200 mg total) by mouth 3 (three) times daily as needed for cough. 12/15/21   Danton Clap, PA-C  carvedilol (COREG) 25 MG tablet Take 25 mg by mouth 2 (two) times daily with a  meal. 03/22/18   [provider]  hydrALAZINE (APRESOLINE) 25 MG tablet Take 25 mg by mouth 2 (two) times daily. 04/13/19   [provider]  ipratropium (ATROVENT) 0.06 % nasal spray Place 2 sprays into both nostrils 4 (four) times daily. 12/15/21   Danton Clap, PA-C  Ipratropium-Albuterol (COMBIVENT) 20-100 MCG/ACT AERS respimat Inhale 1 puff into the lungs every 6 (six) hours. As needed for wheezing 03/23/20   Katy Apo, NP  levothyroxine (SYNTHROID, LEVOTHROID) 100 MCG tablet Take 1 tablet (100 mcg total) by mouth every morning. 09/13/18   Salary, Avel Peace, MD  lidocaine (XYLOCAINE) 2 % solution Use as directed 15 mLs in the mouth or throat every 3 (three) hours as needed for mouth pain (swish and spit). 12/15/21   Danton Clap, PA-C  loratadine (CLARITIN) 10 MG tablet Take 1 tablet (10 mg total) by mouth daily. 09/12/18   Salary, Avel Peace, MD  olmesartan (BENICAR) 40 MG tablet Take 40 mg by mouth daily. 03/24/19   [provider]  ondansetron (ZOFRAN) 4 MG tablet Take 1 tablet (4 mg total) by mouth every 8 (eight) hours as needed for nausea or vomiting. 12/15/21   Danton Clap, PA-C  promethazine-dextromethorphan (PROMETHAZINE-DM) 6.25-15 MG/5ML syrup Take 5  mLs by mouth 4 (four) times daily as needed. 12/15/21   Danton Clap, PA-C  spironolactone (ALDACTONE) 25 MG tablet Take by mouth. 03/16/19 05/31/21  [provider]  amLODipine (NORVASC) 5 MG tablet Take 1 tablet (5 mg total) by mouth daily. 09/12/18 03/15/19  Salary, Avel Peace, MD  famotidine (PEPCID) 20 MG tablet Take 1 tablet (20 mg total) by mouth 2 (two) times daily for 10 days. 03/15/19 04/23/19  Menshew, Dannielle Karvonen, PA-C  lisinopril-hydrochlorothiazide (PRINZIDE,ZESTORETIC) 20-12.5 MG tablet Take 1 tablet by mouth daily. 03/22/18 04/23/19  [provider]  potassium chloride (KLOR-CON) 20 MEQ packet Take 20 mEq by mouth daily. 09/12/18 03/15/19  Salary, Avel Peace, MD    Family History No  family history on file.  Social History Social History   Tobacco Use   Smoking status: Never   Smokeless tobacco: Never  Vaping Use   Vaping Use: Never used  Substance Use Topics   Alcohol use: No   Drug use: No     Allergies   Amlodipine, Doxycycline, Lisinopril, and Penicillins   Review of Systems Review of Systems  Constitutional:  Positive for fever. Negative for chills and fatigue.  HENT:  Positive for postnasal drip, rhinorrhea, sinus pressure, sinus pain and sneezing. Negative for congestion, ear pain, sore throat and voice change.   Eyes:  Negative for pain and redness.  Respiratory:  Positive for cough and chest tightness. Negative for shortness of breath.   Cardiovascular:  Negative for chest pain and palpitations.  Gastrointestinal:  Negative for diarrhea, nausea and vomiting.  Musculoskeletal:  Negative for myalgias.  Skin:  Negative for rash.  Neurological:  Positive for headaches. Negative for dizziness and weakness.     Physical Exam Triage Vital Signs ED Triage Vitals  Enc Vitals Group     BP 06/13/22 1135 (!) 194/113     Pulse Rate 06/13/22 1135 66     Resp 06/13/22 1135 18     Temp --      Temp Source 06/13/22 1135 Oral     SpO2 06/13/22 1135 99 %     Weight --      Height --      Head Circumference --      Peak Flow --      Pain Score 06/13/22 1132 8     Pain Loc --      Pain Edu? --      Excl. in [place]? --    No data found.  Updated Vital Signs BP (!) 194/113 (BP Location: Left Arm)   Pulse 66   Resp 18   SpO2 99%   Physical Exam Constitutional:      Appearance: She is obese.  HENT:     Head: Normocephalic.     Mouth/Throat:     Mouth: Mucous membranes are moist.  Eyes:     Extraocular Movements: Extraocular movements intact.     Pupils: Pupils are equal, round, and reactive to light.  Cardiovascular:     Rate and Rhythm: Normal rate and regular rhythm.  Pulmonary:     Effort: Pulmonary effort is normal.     Breath sounds:  Normal breath sounds. No wheezing, rhonchi or rales.  Lymphadenopathy:     Cervical: No cervical adenopathy.  Skin:    General: Skin is warm and dry.     Findings: No rash.  Neurological:     Mental Status: She is alert and oriented to person, place, and time.  Cranial Nerves: No cranial nerve deficit.      UC Treatments / Results   Medications Ordered in UC Medications - No data to display  Initial Impression / Assessment and Plan / UC Course  I have reviewed the triage vital signs and the nursing notes.  Pertinent labs & imaging results that were available during my care of the patient were reviewed by me and considered in my medical decision making (see chart for details).     56 year old female presents with sinus symptoms x3 days.  DDx include viral sinusitis, bacterial sinusitis, allergic rhinitis, viral URI with cough.  She declines COVID/flu testing at this time.  Exam most consistent with a viral sinusitis.  Recommend treatment with Prednisone burst 20 mg daily x5 days.  Advised her to use Zyrtec and Flonase OTC.  She declines Rx cough syrup at this time.  She is requesting antibiotics but advised her why antibiotics are not indicated this early in her symptoms.  Encouraged rest and fluids.  Advised her to follow-up with her PCP if symptoms persist or worsen.  Final Clinical Impressions(s) / UC Diagnoses   Final diagnoses:  Viral sinusitis     Discharge Instructions      You were seen today for sinus symptoms.  You have a viral sinus infection.  This is not something that we treat with antibiotics just yet.  I am putting on prednisone 20 mg daily for the next 5 days.  You should continue Zyrtec, Flonase and cough syrup OTC as needed.  Please follow-up with your PCP if symptoms persist or worsen.     ED Prescriptions     Medication Sig Dispense Auth. Provider   predniSONE (DELTASONE) 20 MG tablet Take 1 tablet (20 mg total) by mouth daily with breakfast. 5  tablet Michael Ventresca, Salvadore Oxford, NP      PDMP not reviewed this encounter.   Lorre Munroe, NP 06/13/22 1157

## 2022-06-13 NOTE — Discharge Instructions (Addendum)
You were seen today for sinus symptoms.  You have a viral sinus infection.  This is not something that we treat with antibiotics just yet.  I am putting on prednisone 20 mg daily for the next 5 days.  You should continue Zyrtec, Flonase and cough syrup OTC as needed.  Please follow-up with your PCP if symptoms persist or worsen.

## 2022-06-13 NOTE — ED Triage Notes (Signed)
Pt c/o chest tightness, sneezing, headache/sinus pain, drainage in throat, ears itching, and cough for 3 days. Took tylneol sinus and other OTC without relief.

## 2022-11-03 ENCOUNTER — Telehealth: Payer: Self-pay | Admitting: Family

## 2022-11-03 ENCOUNTER — Ambulatory Visit
Admission: EM | Admit: 2022-11-03 | Discharge: 2022-11-03 | Disposition: A | Discharge status: Home / Self Care | Payer: No Typology Code available for payment source | Attending: Family | Admitting: Family

## 2022-11-03 DIAGNOSIS — R5383 Other fatigue: Secondary | ICD-10-CM

## 2022-11-03 DIAGNOSIS — I10 Essential (primary) hypertension: Secondary | ICD-10-CM

## 2022-11-03 DIAGNOSIS — R197 Diarrhea, unspecified: Secondary | ICD-10-CM

## 2022-11-03 DIAGNOSIS — Z1152 Encounter for screening for COVID-19: Secondary | ICD-10-CM | POA: Insufficient documentation

## 2022-11-03 DIAGNOSIS — R112 Nausea with vomiting, unspecified: Secondary | ICD-10-CM

## 2022-11-03 LAB — SARS CORONAVIRUS 2 BY RT PCR: SARS Coronavirus 2 by RT PCR: NEGATIVE

## 2022-11-03 MED ORDER — ONDANSETRON HCL 4 MG PO TABS: 4.0000 mg | ORAL_TABLET | Freq: Three times a day (TID) | ORAL | 0 refills | Status: DC | PRN

## 2022-11-03 MED ORDER — DICYCLOMINE HCL 20 MG PO TABS: 20.0000 mg | ORAL_TABLET | Freq: Two times a day (BID) | ORAL | 0 refills | Status: DC | PRN

## 2022-11-03 NOTE — Discharge Instructions (Signed)
Recommend try Bentyl  every 12 hours as needed for abdominal pain/spasms. May continue Pepto-bismul as needed. If nauseous, may take Zofran  every 8 hours as needed. Continue to eat small, frequent bland foods. Avoid spicy, fried foods and most fruits and veggies for now until symptoms improve. Follow-up in 2 to 3 days with your PCP if not improving.

## 2022-11-03 NOTE — ED Provider Notes (Signed)
[facility]    CSN: [id number] Arrival date & time: 11/03/22  0857      History   Chief Complaint Chief Complaint  Patient presents with   Chills   Diarrhea   Nausea   Vomiting    HPI Kayla Christensen is a 57 y.o. female.   57 year old female presents with diarrhea that started about 4 days ago. Also started vomiting the same day. The nausea and vomiting improved the next day and she was able to eat oatmeal but the diarrhea continued. She took Peptobismul and Immodium with some relief. Yesterday she noticed her stools were darker/black but continued to be watery and not-formed. Denies any fever today, nasal congestion or cough. She ate [employer] yesterday (fries and chicken)   The history is provided by the patient.    Past Medical History:  Diagnosis Date   History of kidney stones    Hypertension    Morbid obesity     Patient Active Problem List   Diagnosis Date Noted   Sepsis due to pneumonia 09/09/2018    Past Surgical History:  Procedure Laterality Date   CHOLECYSTECTOMY     THYROIDECTOMY     TUBAL LIGATION      OB History   No obstetric history on file.      Home Medications    Prior to Admission medications   Medication Sig Start Date End Date Taking? Authorizing Provider  dicyclomine (BENTYL) 20 MG tablet Take 1 tablet (20 mg total) by mouth 2 (two) times daily as needed (abdominal spasms/pain). 11/03/22  Yes Astrid Vides, Ali Lowe, NP  atorvastatin (LIPITOR) 40 MG tablet Take 40 mg by mouth daily. 03/22/18   [provider]  carvedilol (COREG) 25 MG tablet Take 25 mg by mouth 2 (two) times daily with a meal. 03/22/18   [provider]  hydrALAZINE (APRESOLINE) 25 MG tablet Take 25 mg by mouth 2 (two) times daily. 04/13/19   [provider]  levothyroxine (SYNTHROID, LEVOTHROID) 100 MCG tablet Take 1 tablet (100 mcg total) by mouth every morning. 09/13/18   Salary, Evelena Asa, MD  loratadine (CLARITIN) 10 MG tablet  Take 1 tablet (10 mg total) by mouth daily. 09/12/18   Salary, Evelena Asa, MD  olmesartan (BENICAR) 40 MG tablet Take 40 mg by mouth daily. 03/24/19   [provider]  ondansetron (ZOFRAN) 4 MG tablet Take 1 tablet (4 mg total) by mouth every 8 (eight) hours as needed for nausea or vomiting. 11/03/22   Ashely Joshua, Ali Lowe, NP  spironolactone (ALDACTONE) 25 MG tablet Take by mouth. 03/16/19 05/31/21  [provider]  amLODipine (NORVASC) 5 MG tablet Take 1 tablet (5 mg total) by mouth daily. 09/12/18 03/15/19  Salary, Evelena Asa, MD  famotidine (PEPCID) 20 MG tablet Take 1 tablet (20 mg total) by mouth 2 (two) times daily for 10 days. 03/15/19 04/23/19  Menshew, Charlesetta Ivory, PA-C  lisinopril-hydrochlorothiazide (PRINZIDE,ZESTORETIC) 20-12.5 MG tablet Take 1 tablet by mouth daily. 03/22/18 04/23/19  [provider]  potassium chloride (KLOR-CON) 20 MEQ packet Take 20 mEq by mouth daily. 09/12/18 03/15/19  Salary, Evelena Asa, MD    Family History History reviewed. No pertinent family history.  Social History Social History   Tobacco Use   Smoking status: Never   Smokeless tobacco: Never  Vaping Use   Vaping Use: Never used  Substance Use Topics   Alcohol use: No   Drug use: No     Allergies  Amlodipine, Doxycycline, Lisinopril, and Penicillins   Review of Systems Review of Systems   Physical Exam Triage Vital Signs ED Triage Vitals [11/03/22 0947]  Enc Vitals Group     BP (!) 159/101     Pulse Rate 60     Resp 18     Temp 97.6 F (36.4 C)     Temp Source Oral     SpO2 96 %     Weight      Height      Head Circumference      Peak Flow      Pain Score 0     Pain Loc      Pain Edu?      Excl. in GC?    No data found.  Updated Vital Signs BP (!) 159/101 (BP Location: Left Arm)   Pulse 60   Temp 97.6 F (36.4 C) (Oral)   Resp 18   SpO2 96%   Visual Acuity Right Eye Distance:   Left Eye Distance:   Bilateral Distance:    Right Eye Near:   Left Eye  Near:    Bilateral Near:     Physical Exam Vitals and nursing note reviewed.  Constitutional:      General: She is awake. She is not in acute distress.    Appearance: She is ill-appearing.     Comments: She is sitting in the exam chair in no acute distress but appears tired and ill.   Cardiovascular:     Rate and Rhythm: Normal rate and regular rhythm.     Heart sounds: Normal heart sounds. No murmur heard. Pulmonary:     Effort: Pulmonary effort is normal.     Breath sounds: Normal breath sounds.  Abdominal:     General: Bowel sounds are increased.     Palpations: Abdomen is soft. There is no hepatomegaly or splenomegaly.     Tenderness: There is generalized abdominal tenderness. There is no right CVA tenderness, left CVA tenderness, guarding or rebound.  Neurological:     Mental Status: She is alert.  Psychiatric:        Behavior: Behavior is cooperative.      UC Treatments / Results  Labs (all labs ordered are listed, but only abnormal results are displayed) Labs Reviewed  SARS CORONAVIRUS 2 BY RT PCR    EKG   Radiology No results found.  Procedures Procedures (including critical care time)  Medications Ordered in UC Medications - No data to display  Initial Impression / Assessment and Plan / UC Course  I have reviewed the triage vital signs and the nursing notes.  Pertinent labs & imaging results that were available during my care of the patient were reviewed by me and considered in my medical decision making (see chart for details).     *** Final Clinical Impressions(s) / UC Diagnoses   Final diagnoses:  Nausea vomiting and diarrhea  Fatigue, unspecified type  Encounter for screening for COVID-19  Elevated blood pressure reading with diagnosis of hypertension     Discharge Instructions      Recommend try Bentyl  every 12 hours as needed for abdominal pain/spasms. May continue Pepto-bismul as needed. If nauseous, may take Zofran  every 8  hours as needed. Continue to eat small, frequent bland foods. Avoid spicy, fried foods and most fruits and veggies for now until symptoms improve. Follow-up in 2 to 3 days with your PCP if not improving.     ED Prescriptions  Medication Sig Dispense Auth. Provider   ondansetron (ZOFRAN) 4 MG tablet Take 1 tablet (4 mg total) by mouth every 8 (eight) hours as needed for nausea or vomiting. 12 tablet Kaeley Vinje, Ali Lowe, NP   dicyclomine (BENTYL) 20 MG tablet Take 1 tablet (20 mg total) by mouth 2 (two) times daily as needed (abdominal spasms/pain). 20 tablet Demauri Advincula, Ali Lowe, NP      PDMP not reviewed this encounter.

## 2022-11-03 NOTE — ED Triage Notes (Signed)
Pt presents with c/o diarrhea and vomiting x 4 days.   States she started having chills and fever..   Home interventions: Pepto bismol and omeprazole.

## 2022-11-03 NOTE — Telephone Encounter (Signed)
Notified patient of negative COVID test results. Patient to continue current treatment plan and follow-up with her PCP if she is not improving within 2 to 3 days.

## 2022-11-22 ENCOUNTER — Ambulatory Visit
Admission: EM | Admit: 2022-11-22 | Discharge: 2022-11-22 | Disposition: A | Discharge status: Home / Self Care | Payer: 59 | Attending: Emergency Medicine | Admitting: Emergency Medicine

## 2022-11-22 ENCOUNTER — Ambulatory Visit (INDEPENDENT_AMBULATORY_CARE_PROVIDER_SITE_OTHER): Payer: 59

## 2022-11-22 DIAGNOSIS — J208 Acute bronchitis due to other specified organisms: Secondary | ICD-10-CM

## 2022-11-22 DIAGNOSIS — R062 Wheezing: Secondary | ICD-10-CM | POA: Diagnosis not present

## 2022-11-22 DIAGNOSIS — B9689 Other specified bacterial agents as the cause of diseases classified elsewhere: Secondary | ICD-10-CM

## 2022-11-22 DIAGNOSIS — R059 Cough, unspecified: Secondary | ICD-10-CM | POA: Diagnosis not present

## 2022-11-22 DIAGNOSIS — R0602 Shortness of breath: Secondary | ICD-10-CM | POA: Diagnosis not present

## 2022-11-22 MED ORDER — IPRATROPIUM-ALBUTEROL 0.5-2.5 (3) MG/3ML IN SOLN
3.0000 mL | Freq: Once | RESPIRATORY_TRACT | Status: AC
  Administered 2022-11-22: 3 mL via RESPIRATORY_TRACT

## 2022-11-22 MED ORDER — PREDNISONE 50 MG PO TABS: 50.0000 mg | ORAL_TABLET | Freq: Every day | ORAL | 0 refills | Status: AC

## 2022-11-22 MED ORDER — AZITHROMYCIN 250 MG PO TABS: ORAL_TABLET | ORAL | 0 refills | Status: AC

## 2022-11-22 MED ORDER — ALBUTEROL SULFATE HFA 108 (90 BASE) MCG/ACT IN AERS: 1.0000 | INHALATION_SPRAY | Freq: Four times a day (QID) | RESPIRATORY_TRACT | 0 refills | Status: DC | PRN

## 2022-11-22 NOTE — ED Triage Notes (Addendum)
Patient with complaints cough and sinus pain for about 4 days. States she has tried OTC alka-seltzer cold and sinus with no relief. Patient hypertensive in triage at 195/110. States she did not take her BP medication this morning.

## 2022-11-22 NOTE — ED Provider Notes (Signed)
[facility] - [place], [place]   Name: Kayla Christensen DOB: 10/27/1965 MRN: [id number] CSN: [id number] PCP: Margorie John, MD  Arrival date and time:  11/22/22 1203  Chief Complaint:  Cough and Facial Pain   NOTE: Prior to seeing the patient today, I have reviewed the triage nursing documentation and vital signs. Clinical staff has updated patient's PMH/PSHx, current medication list, and drug allergies/intolerances to ensure comprehensive history available to assist in medical decision making.   History:   HPI: Kayla Christensen is a 57 y.o. female who presents today with complaints of shortness of breath, wheezing and chest pressure.  Patient noticed the symptoms for approximately 5-6 days.  She also endorses decreased energy, facial pain, and sore throat.  She denies any fevers but has felt febrile and has feelings of general malaise.  No recent antibiotics.  No known pulmonary disorders.  She states she does not smoke or has never smoked but she is around those who do.  She has used over-the-counter medications including OTC decongestions and her previously prescribed inhaler.   Past Medical History:  Diagnosis Date   History of kidney stones    Hypertension    Morbid obesity (HCC)     Past Surgical History:  Procedure Laterality Date   CHOLECYSTECTOMY     THYROIDECTOMY     TUBAL LIGATION      No family history on file.  Social History   Tobacco Use   Smoking status: Never   Smokeless tobacco: Never  Vaping Use   Vaping Use: Never used  Substance Use Topics   Alcohol use: No   Drug use: No    Patient Active Problem List   Diagnosis Date Noted   Sepsis due to pneumonia (HCC) 09/09/2018    Home Medications:    No outpatient medications have been marked as taking for the 11/22/22 encounter [facility] Encounter).    Allergies:   Amlodipine, Doxycycline, Lisinopril, and Penicillins  Review of Systems (ROS): Review of Systems   Constitutional:  Positive for activity change, appetite change, chills and fatigue.  HENT:  Positive for postnasal drip, sinus pressure, sinus pain and sore throat. Negative for congestion and sneezing.   Respiratory:  Positive for cough, shortness of breath and wheezing.   Cardiovascular:  Negative for chest pain.  Gastrointestinal:  Negative for diarrhea, nausea and vomiting.  All other systems reviewed and are negative.    Vital Signs: Today's Vitals   11/22/22 1215  BP: (!) 195/110  Pulse: 67  Resp: 18  Temp: 98.7 F (37.1 C)  TempSrc: Oral  SpO2: 92%  PainSc: 3     Physical Exam: Physical Exam Vitals and nursing note reviewed.  Constitutional:      Appearance: Normal appearance. She is ill-appearing. She is not toxic-appearing or diaphoretic.  HENT:     Right Ear: Tympanic membrane normal.     Left Ear: Tympanic membrane is erythematous.     Nose:     Right Sinus: Maxillary sinus tenderness and frontal sinus tenderness present.     Left Sinus: Maxillary sinus tenderness and frontal sinus tenderness present.  Cardiovascular:     Rate and Rhythm: Normal rate and regular rhythm.     Pulses: Normal pulses.     Heart sounds: Normal heart sounds.  Pulmonary:     Effort: Pulmonary effort is normal. No accessory muscle usage or respiratory distress.     Breath sounds: Examination of the left-upper field reveals rales. Examination  of the left-lower field reveals rales. Wheezing and rales present.  Skin:    General: Skin is warm and dry.  Neurological:     General: No focal deficit present.     Mental Status: She is alert and oriented to person, place, and time.  Psychiatric:        Mood and Affect: Mood normal.        Behavior: Behavior normal.     Urgent Care Treatments / Results:   LABS: PLEASE NOTE: all labs that were ordered this encounter are listed, however only abnormal results are displayed. Labs Reviewed - No data to  display  EKG: -None  RADIOLOGY: DG Chest 2 View  Result Date: 11/22/2022 CLINICAL DATA:  Wheezing.  Cough.  Shortness of breath. EXAM: CHEST - 2 VIEW COMPARISON:  Chest x-ray March 23, 2020 FINDINGS: The cardiomediastinal silhouette is stable with mild cardiomegaly. Hila and mediastinum are normal. No pneumothorax. No nodules or masses. Probable mild atelectasis in the left base. No evidence of pneumonia. IMPRESSION: Probable mild atelectasis in the left base. No other acute abnormalities. Electronically Signed   By: Gerome Sam III M.D.   On: 11/22/2022 13:26    PROCEDURES: Procedures  MEDICATIONS RECEIVED THIS VISIT: Medications  ipratropium-albuterol (DUONEB) 0.5-2.5 (3) MG/3ML nebulizer solution 3 mL (3 mLs Nebulization Given 11/22/22 1256)    PERTINENT CLINICAL COURSE NOTES/UPDATES:   Initial Impression / Assessment and Plan / Urgent Care Course:  Pertinent labs & imaging results that were available during my care of the patient were personally reviewed by me and considered in my medical decision making (see lab/imaging section of note for values and interpretations).  Kayla Christensen is a 57 y.o. female who presents to [facility] today with complaints of coughing and wheezing, diagnosed with bacterial bronchitis, and treated as such with the medications below. NP and patient reviewed discharge instructions below during visit.   Patient is well appearing overall in clinic today. She does not appear to be in any acute distress. Presenting symptoms (see HPI) and exam as documented above.   I have reviewed the follow up and strict return precautions for any new or worsening symptoms. Patient is aware of symptoms that would be deemed urgent/emergent, and would thus require further evaluation either here or in the emergency department. At the time of discharge, she verbalized understanding and consent with the discharge plan as it was reviewed with her. All questions  were fielded by provider and/or clinic staff prior to patient discharge.    Final Clinical Impressions / Urgent Care Diagnoses:   Final diagnoses:  Acute bacterial bronchitis    New Prescriptions:  [place] Controlled Substance Registry consulted? Not Applicable  Meds ordered this encounter  Medications   ipratropium-albuterol (DUONEB) 0.5-2.5 (3) MG/3ML nebulizer solution 3 mL   azithromycin (ZITHROMAX Z-PAK) 250 MG tablet    Sig: Take 2 tablets (500 mg total) by mouth daily for 1 day, THEN 1 tablet (250 mg total) daily for 4 days.    Dispense:  6 tablet    Refill:  0   predniSONE (DELTASONE) 50 MG tablet    Sig: Take 1 tablet (50 mg total) by mouth daily for 5 days.    Dispense:  5 tablet    Refill:  0   albuterol (VENTOLIN HFA) 108 (90 Base) MCG/ACT inhaler    Sig: Inhale 1-2 puffs into the lungs every 6 (six) hours as needed for wheezing or shortness of breath.    Dispense:  6.7 g    Refill:  0      Discharge Instructions      You were seen for wheezing and shortness of breath and are being treated for bacterial bronchitis.   -Chest x-ray did not show pneumonia; you are being treated for a bacterial bronchitis. - Take the antibiotics as prescribed until they're finished. If you think you're having a reaction, stop the medication, take benadryl and go to the nearest urgent care/emergency room. Take a probiotic while taking the antibiotic to decrease the chances of stomach upset.  -You are also being prescribed a steroid.  Take as directed. -Use your albuterol inhaler every 6 hours for the first 2 days, then use as needed. -Push fluids, drinking a lot of water and Pedialyte. -Follow-up if your symptoms do not resolve after completing treatment. -Stay away from over-the-counter medications that contain decongestions as they will continue to increase your blood pressure.  Take care, Dr. Sharlet Salina, NP-c      Recommended Follow up Care:  Patient encouraged to follow up with  the following provider within the specified time frame, or sooner as dictated by the severity of her symptoms. As always, she was instructed that for any urgent/emergent care needs, she should seek care either here or in the emergency department for more immediate evaluation.   Bailey Mech, DNP, NP-c   Bailey Mech, NP 11/22/22 [phone]

## 2022-11-22 NOTE — Discharge Instructions (Addendum)
You were seen for wheezing and shortness of breath and are being treated for bacterial bronchitis.   -Chest x-ray did not show pneumonia; you are being treated for a bacterial bronchitis. - Take the antibiotics as prescribed until they're finished. If you think you're having a reaction, stop the medication, take benadryl and go to the nearest urgent care/emergency room. Take a probiotic while taking the antibiotic to decrease the chances of stomach upset.  -You are also being prescribed a steroid.  Take as directed. -Use your albuterol inhaler every 6 hours for the first 2 days, then use as needed. -Push fluids, drinking a lot of water and Pedialyte. -Follow-up if your symptoms do not resolve after completing treatment. -Stay away from over-the-counter medications that contain decongestions as they will continue to increase your blood pressure.  Take care, Dr. Sharlet Salina, NP-c

## 2023-02-04 DIAGNOSIS — Z6841 Body Mass Index (BMI) 40.0 and over, adult: Secondary | ICD-10-CM | POA: Diagnosis not present

## 2023-02-04 DIAGNOSIS — I10 Essential (primary) hypertension: Secondary | ICD-10-CM | POA: Diagnosis not present

## 2023-02-04 DIAGNOSIS — Z1329 Encounter for screening for other suspected endocrine disorder: Secondary | ICD-10-CM | POA: Diagnosis not present

## 2023-02-04 DIAGNOSIS — E785 Hyperlipidemia, unspecified: Secondary | ICD-10-CM | POA: Diagnosis not present

## 2023-02-05 ENCOUNTER — Emergency Department
Admission: EM | Admit: 2023-02-05 | Discharge: 2023-02-06 | Disposition: A | Discharge status: Home / Self Care | Payer: 59 | Attending: Emergency Medicine | Admitting: Emergency Medicine

## 2023-02-05 ENCOUNTER — Emergency Department: Payer: 59

## 2023-02-05 ENCOUNTER — Ambulatory Visit (INDEPENDENT_AMBULATORY_CARE_PROVIDER_SITE_OTHER)
Admission: EM | Admit: 2023-02-05 | Discharge: 2023-02-05 | Disposition: A | Discharge status: Home / Self Care | Payer: 59 | Source: Home / Self Care

## 2023-02-05 ENCOUNTER — Other Ambulatory Visit: Payer: Self-pay

## 2023-02-05 DIAGNOSIS — R1030 Lower abdominal pain, unspecified: Secondary | ICD-10-CM | POA: Insufficient documentation

## 2023-02-05 DIAGNOSIS — R110 Nausea: Secondary | ICD-10-CM | POA: Insufficient documentation

## 2023-02-05 DIAGNOSIS — D219 Benign neoplasm of connective and other soft tissue, unspecified: Secondary | ICD-10-CM

## 2023-02-05 DIAGNOSIS — D259 Leiomyoma of uterus, unspecified: Secondary | ICD-10-CM | POA: Diagnosis not present

## 2023-02-05 DIAGNOSIS — R102 Pelvic and perineal pain: Secondary | ICD-10-CM | POA: Diagnosis not present

## 2023-02-05 DIAGNOSIS — R109 Unspecified abdominal pain: Secondary | ICD-10-CM

## 2023-02-05 DIAGNOSIS — K449 Diaphragmatic hernia without obstruction or gangrene: Secondary | ICD-10-CM | POA: Diagnosis not present

## 2023-02-05 DIAGNOSIS — R9389 Abnormal findings on diagnostic imaging of other specified body structures: Secondary | ICD-10-CM | POA: Diagnosis not present

## 2023-02-05 LAB — URINALYSIS, ROUTINE W REFLEX MICROSCOPIC
Bacteria, UA: NONE SEEN
Bilirubin Urine: NEGATIVE
Glucose, UA: NEGATIVE mg/dL
Hgb urine dipstick: NEGATIVE
Ketones, ur: NEGATIVE mg/dL
Leukocytes,Ua: NEGATIVE
Nitrite: NEGATIVE
Protein, ur: 30 mg/dL — AB
Specific Gravity, Urine: 1.012 (ref 1.005–1.030)
pH: 6 (ref 5.0–8.0)

## 2023-02-05 LAB — COMPREHENSIVE METABOLIC PANEL
ALT: 17 U/L (ref 0–44)
AST: 21 U/L (ref 15–41)
Albumin: 4.7 g/dL (ref 3.5–5.0)
Alkaline Phosphatase: 62 U/L (ref 38–126)
Anion gap: 10 (ref 5–15)
BUN: 18 mg/dL (ref 6–20)
CO2: 26 mmol/L (ref 22–32)
Calcium: 9.3 mg/dL (ref 8.9–10.3)
Chloride: 101 mmol/L (ref 98–111)
Creatinine, Ser: 0.8 mg/dL (ref 0.44–1.00)
GFR, Estimated: >60 mL/min (ref >60)
Glucose, Bld: 103 mg/dL — ABNORMAL HIGH (ref 70–99)
Potassium: 3.2 mmol/L — ABNORMAL LOW (ref 3.5–5.1)
Sodium: 137 mmol/L (ref 135–145)
Total Bilirubin: 0.5 mg/dL (ref 0.3–1.2)
Total Protein: 8.9 g/dL — ABNORMAL HIGH (ref 6.5–8.1)

## 2023-02-05 LAB — URINALYSIS, W/ REFLEX TO CULTURE (INFECTION SUSPECTED)
Bilirubin Urine: NEGATIVE
Glucose, UA: NEGATIVE mg/dL
Ketones, ur: NEGATIVE mg/dL
Leukocytes,Ua: NEGATIVE
Nitrite: NEGATIVE
Protein, ur: 100 mg/dL — AB
Specific Gravity, Urine: 1.02 (ref 1.005–1.030)
pH: 7 (ref 5.0–8.0)

## 2023-02-05 LAB — CBC
HCT: 40 % (ref 36.0–46.0)
Hemoglobin: 13.2 g/dL (ref 12.0–15.0)
MCH: 27.3 pg (ref 26.0–34.0)
MCHC: 33 g/dL (ref 30.0–36.0)
MCV: 82.8 fL (ref 80.0–100.0)
Platelets: 279 10*3/uL (ref 150–400)
RBC: 4.83 MIL/uL (ref 3.87–5.11)
RDW: 15.1 % (ref 11.5–15.5)
WBC: 6.6 10*3/uL (ref 4.0–10.5)
nRBC: 0 % (ref 0.0–0.2)

## 2023-02-05 LAB — LIPASE, BLOOD: Lipase: 37 U/L (ref 11–51)

## 2023-02-05 MED ORDER — FENTANYL CITRATE PF 50 MCG/ML IJ SOSY
50.0000 ug | PREFILLED_SYRINGE | Freq: Once | INTRAMUSCULAR | Status: AC
  Administered 2023-02-05: 50 ug via INTRAVENOUS
  Filled 2023-02-05: qty 1

## 2023-02-05 MED ORDER — IOHEXOL 350 MG/ML SOLN
100.0000 mL | Freq: Once | INTRAVENOUS | Status: AC | PRN
  Administered 2023-02-05: 100 mL via INTRAVENOUS

## 2023-02-05 MED ORDER — SODIUM CHLORIDE 0.9 % IV BOLUS
1000.0000 mL | Freq: Once | INTRAVENOUS | Status: AC
  Administered 2023-02-05: 1000 mL via INTRAVENOUS

## 2023-02-05 NOTE — ED Provider Notes (Signed)
[facility] Provider Note    Event Date/Time   First MD Initiated Contact with Patient 02/05/23 2018     (approximate)   History   Abdominal Pain   HPI  Kayla Christensen is a 57 y.o. female who presents to the emergency department today with concerns for abdominal pain.  The pain is located in the suprapubic region.  Started a few days ago.  She does notice that the pain is worse with urination or attempted urination.  She still has some pain just at rest.  This has been accompanied by some nausea.  She has not noticed any bad odor to urine or change in color.  She has not noticed any change in defecation.  She tried Prilosec and Pepto-Bismol.  Had slight relief with Pepto-Bismol.  States is somewhat reminds her of when she had a kidney stone a number of years ago.     Physical Exam   Triage Vital Signs: ED Triage Vitals  Encounter Vitals Group     BP 02/05/23 1902 (S) (!) 208/99     Systolic BP Percentile --      Diastolic BP Percentile --      Pulse Rate 02/05/23 1900 63     Resp 02/05/23 1900 16     Temp 02/05/23 1900 98.7 F (37.1 C)     Temp Source 02/05/23 1900 Oral     SpO2 02/05/23 1900 95 %     Weight 02/05/23 1900 266 lb 12.1 oz (121 kg)     Height 02/05/23 1900 5\' 9"  (1.753 m)     Head Circumference --      Peak Flow --      Pain Score 02/05/23 1900 10     Pain Loc --      Pain Education --      Exclude from Growth Chart --     Most recent vital signs: Vitals:   02/05/23 1900 02/05/23 1902  BP:  (S) (!) 208/99  Pulse: 63   Resp: 16   Temp: 98.7 F (37.1 C)   SpO2: 95%    General: Awake, alert, oriented. CV:  Good peripheral perfusion. Regular rate and rhythm. Resp:  Normal effort. Lungs clear. Abd:  No distention. Minimally tender in the suprapubic region.   ED Results / Procedures / Treatments   Labs (all labs ordered are listed, but only abnormal results are displayed) Labs Reviewed  COMPREHENSIVE METABOLIC  PANEL - Abnormal; Notable for the following components:      Result Value   Potassium 3.2 (*)    Glucose, Bld 103 (*)    Total Protein 8.9 (*)    All other components within normal limits  URINALYSIS, ROUTINE W REFLEX MICROSCOPIC - Abnormal; Notable for the following components:   Color, Urine STRAW (*)    APPearance CLEAR (*)    Protein, ur 30 (*)    All other components within normal limits  LIPASE, BLOOD  CBC     EKG  None   RADIOLOGY I independently interpreted and visualized the CT ab/pel. My interpretation: No free air Radiology interpretation:  IMPRESSION:  1. Uterine fibroids.  2. Moderate amount of endometrial fluid with additional findings  that may represent hemorrhagic material within the endometrial  canal. Correlation with pelvic ultrasound is recommended.  3. Evidence of prior cholecystectomy.  4. Small hiatal hernia.  5. Aortic atherosclerosis.    Aortic Atherosclerosis (ICD10-I70.0).     PROCEDURES:  Critical Care performed:  No    MEDICATIONS ORDERED IN ED: Medications - No data to display   IMPRESSION / MDM / ASSESSMENT AND PLAN / ED COURSE  I reviewed the triage vital signs and the nursing notes.                              Differential diagnosis includes, but is not limited to, diverticulitis, appendicitis, UTI, kidney stone.  Patient's presentation is most consistent with acute presentation with potential threat to life or bodily function.   The patient is on the cardiac monitor to evaluate for evidence of arrhythmia and/or significant heart rate changes.  Patient presented to the emergency department today because of concerns for suprapubic pain.  Patient does come from urgent care.  Blood work without concerning leukocytosis patient is febrile however on exam patient is tender in lower abdomen.  Will obtain CT scan to further evaluate.  CT scan is concerning for uterine fibroids and debris.  Recommended ultrasound.  Ultrasound  pending at time of signout.      FINAL CLINICAL IMPRESSION(S) / ED DIAGNOSES   Lower abdominal pain   Note:  This document was prepared using Dragon voice recognition software and may include unintentional dictation errors.    Phineas Semen, MD 02/05/23 [phone]

## 2023-02-05 NOTE — ED Triage Notes (Addendum)
Pt to ed from [facility] where she was seen for abd pain and was sent here. Pt has been having belly pain x 2 days. Pt is nauseated but no active vomiting. Pt also is having pain during urination. She states "They checked me at [facility] and it was normal. I also have not had my BP meds".

## 2023-02-05 NOTE — ED Notes (Signed)
Pt here for abdominal pain. Pt had negative UA at urgent care. Pt is in NAD.

## 2023-02-05 NOTE — ED Triage Notes (Signed)
Pt c/o abdominal pain x2-3days  Pt states that the pain today is worse. Pt states that she feels pain when using the bathroom and feels pain along both sides of her stomach.

## 2023-02-05 NOTE — ED Notes (Signed)
Patient transported to CT 

## 2023-02-05 NOTE — ED Provider Notes (Signed)
[facility]    CSN: [id number] Arrival date & time: 02/05/23  1743      History   Chief Complaint Chief Complaint  Patient presents with   Abdominal Pain    HPI Kayla Christensen is a 57 y.o. female.   HPI  57 year old female with a past medical history significant for morbid obesity, hypertension, kidney stones presents for evaluation of 2 to 3 days worth of lower abdominal pain.  She states that the pain is constant and sharp.  In the last 3 to 4 hours it has become a 10/10.  She does endorse a low-grade fever, nausea, vomiting, and some constipation.  She denies any diarrhea.  She has had urinary frequency but she denies pain or urgency.  Also denies hematuria.  Past Medical History:  Diagnosis Date   History of kidney stones    Hypertension    Morbid obesity [facility])     Patient Active Problem List   Diagnosis Date Noted   Sepsis due to pneumonia (HCC) 09/09/2018    Past Surgical History:  Procedure Laterality Date   CHOLECYSTECTOMY     THYROIDECTOMY     TUBAL LIGATION      OB History   No obstetric history on file.      Home Medications    Prior to Admission medications   Medication Sig Start Date End Date Taking? Authorizing Provider  albuterol (VENTOLIN HFA) 108 (90 Base) MCG/ACT inhaler Inhale 1-2 puffs into the lungs every 6 (six) hours as needed for wheezing or shortness of breath. 11/22/22  Yes Bailey Mech, NP  atorvastatin (LIPITOR) 40 MG tablet Take 40 mg by mouth daily. 03/22/18  Yes [provider]  carvedilol (COREG) 25 MG tablet Take 25 mg by mouth 2 (two) times daily with a meal. 03/22/18  Yes [provider]  hydrALAZINE (APRESOLINE) 25 MG tablet Take 25 mg by mouth 2 (two) times daily. 04/13/19  Yes [provider]  levothyroxine (SYNTHROID, LEVOTHROID) 100 MCG tablet Take 1 tablet (100 mcg total) by mouth every morning. 09/13/18  Yes Salary, Evelena Asa, MD  loratadine (CLARITIN) 10 MG tablet Take 1  tablet (10 mg total) by mouth daily. 09/12/18  Yes Salary, Evelena Asa, MD  olmesartan (BENICAR) 40 MG tablet Take 40 mg by mouth daily. 03/24/19  Yes [provider]  ondansetron (ZOFRAN) 4 MG tablet Take 1 tablet (4 mg total) by mouth every 8 (eight) hours as needed for nausea or vomiting. 11/03/22  Yes Amyot, Ali Lowe, NP  dicyclomine (BENTYL) 20 MG tablet Take 1 tablet (20 mg total) by mouth 2 (two) times daily as needed (abdominal spasms/pain). 11/03/22   Sudie Grumbling, NP  spironolactone (ALDACTONE) 25 MG tablet Take by mouth. 03/16/19 05/31/21  [provider]  amLODipine (NORVASC) 5 MG tablet Take 1 tablet (5 mg total) by mouth daily. 09/12/18 03/15/19  Salary, Evelena Asa, MD  famotidine (PEPCID) 20 MG tablet Take 1 tablet (20 mg total) by mouth 2 (two) times daily for 10 days. 03/15/19 04/23/19  Menshew, Charlesetta Ivory, PA-C  lisinopril-hydrochlorothiazide (PRINZIDE,ZESTORETIC) 20-12.5 MG tablet Take 1 tablet by mouth daily. 03/22/18 04/23/19  [provider]  potassium chloride (KLOR-CON) 20 MEQ packet Take 20 mEq by mouth daily. 09/12/18 03/15/19  Salary, Evelena Asa, MD    Family History History reviewed. No pertinent family history.  Social History Social History   Tobacco Use   Smoking status: Never   Smokeless tobacco: Never  Vaping Use  Vaping status: Never Used  Substance Use Topics   Alcohol use: No   Drug use: No     Allergies   Amlodipine, Doxycycline, Lisinopril, and Penicillins   Review of Systems Review of Systems  Constitutional:  Positive for fever.  Gastrointestinal:  Positive for abdominal pain, constipation, nausea and vomiting. Negative for blood in stool and diarrhea.  Genitourinary:  Positive for frequency. Negative for dysuria, hematuria, urgency, vaginal discharge and vaginal pain.     Physical Exam Triage Vital Signs ED Triage Vitals  Encounter Vitals Group     BP      Systolic BP Percentile      Diastolic BP Percentile       Pulse      Resp      Temp      Temp src      SpO2      Weight      Height      Head Circumference      Peak Flow      Pain Score      Pain Loc      Pain Education      Exclude from Growth Chart    No data found.  Updated Vital Signs BP (!) 201/114 (BP Location: Left Arm)   Pulse 60   Temp 97.9 F (36.6 C) (Oral)   Ht 5\' 9"  (1.753 m)   Wt 267 lb (121.1 kg)   SpO2 94%   BMI 39.43 kg/m   Visual Acuity Right Eye Distance:   Left Eye Distance:   Bilateral Distance:    Right Eye Near:   Left Eye Near:    Bilateral Near:     Physical Exam Vitals and nursing note reviewed.  Constitutional:      General: She is in acute distress.     Appearance: Normal appearance. She is obese. She is not ill-appearing.     Comments: Patient appears to be in a moderate degree of pain.  HENT:     Head: Normocephalic and atraumatic.  Cardiovascular:     Rate and Rhythm: Normal rate and regular rhythm.     Pulses: Normal pulses.     Heart sounds: Normal heart sounds. No murmur heard.    No friction rub. No gallop.  Pulmonary:     Effort: Pulmonary effort is normal.     Breath sounds: Normal breath sounds. No wheezing, rhonchi or rales.  Abdominal:     General: Abdomen is flat.     Palpations: Abdomen is soft.     Tenderness: There is no abdominal tenderness. There is no right CVA tenderness, left CVA tenderness, guarding or rebound.  Skin:    General: Skin is warm.     Capillary Refill: Capillary refill takes less than 2 seconds.     Findings: No lesion or rash.  Neurological:     General: No focal deficit present.     Mental Status: She is alert and oriented to person, place, and time.      UC Treatments / Results  Labs (all labs ordered are listed, but only abnormal results are displayed) Labs Reviewed  URINALYSIS, W/ REFLEX TO CULTURE (INFECTION SUSPECTED) - Abnormal; Notable for the following components:      Result Value   Hgb urine dipstick TRACE (*)    Protein, ur  100 (*)    Bacteria, UA FEW (*)    All other components within normal limits    EKG   Radiology No results  found.  Procedures Procedures (including critical care time)  Medications Ordered in UC Medications - No data to display  Initial Impression / Assessment and Plan / UC Course  I have reviewed the triage vital signs and the nursing notes.  Pertinent labs & imaging results that were available during my care of the patient were reviewed by me and considered in my medical decision making (see chart for details).   Patient is a pleasant 57 year old female who appears to be in a moderate degree of pain presenting for evaluation of lower abdominal pain that started 2 to 3 days ago and has been constant.  In the last 3 to 4 hours it has intensified to a 10/10 sharp pain.  Patient has had some nausea and vomiting, low-grade fever, and constipation.  She also reports that she has had some urinary frequency but she denies dysuria, urgency, or hematuria.  No vaginal discharge or itching.  Her abdomen is protuberant but it is soft and I cannot reproduce the tenderness with palpation.  Etiology of the patient's symptoms is unclear.  Given her lower central abdominal pain it could possibly be a urinary tract infection with her urinary frequency.  I will order urinalysis to evaluate for the presence of UTI.  If her urinalysis is negative I will refer her to the emergency department for further evaluation and imaging.  Patient's blood pressure is markedly elevated at 201/114 in triage.  She is on 4 antihypertensive agents and reports that she took them all last night.  Urinalysis shows trace hemoglobin with 100 protein but is negative for leukocyte esterase, or nitrites.  Reflex microscopy shows 6-10 RBCs with few bacteria and no WBCs.  Patient is reporting a sharp increase in pain, feeling hot, and developed nausea and dry heaves in the exam room.  The etiology of the patient's abdominal pain is  unclear but I do feel she was to be evaluated in the emergency department and have a complex imaging of her abdomen.  She has elected to go to [facility] via POV for evaluation of abdominal pain.   Final Clinical Impressions(s) / UC Diagnoses   Final diagnoses:  Abdominal pain, unspecified abdominal location     Discharge Instructions      Please go to the emergency department at [facility] for evaluation of your abdominal pain.  Nothing to eat or drink until after you have been evaluated.     ED Prescriptions   None    PDMP not reviewed this encounter.   Becky Augusta, NP 02/05/23 [place]

## 2023-02-05 NOTE — ED Notes (Addendum)
Assumed care of pt at this time. Pt appears to be resting comfortably. Provided pt with urine specimen cup. No needs identified at this time. Pt accompanied by family member

## 2023-02-05 NOTE — ED Notes (Signed)
ED Provider at bedside. 

## 2023-02-05 NOTE — ED Notes (Signed)
Patient is being discharged from the Urgent Care and sent to the Emergency Department via POV . Per Becky Augusta, NP, patient is in need of higher level of care due to 10/10 abdominal pain. Patient is aware and verbalizes understanding of plan of care.  Vitals:   02/05/23 1814  BP: (!) 201/114  Pulse: 60  Temp: 97.9 F (36.6 C)  SpO2: 94%

## 2023-02-05 NOTE — Discharge Instructions (Signed)
Please go to the emergency department at [facility] for evaluation of your abdominal pain.  Nothing to eat or drink until after you have been evaluated.

## 2023-02-06 MED ORDER — ONDANSETRON 4 MG PO TBDP: 4.0000 mg | ORAL_TABLET | Freq: Three times a day (TID) | ORAL | 0 refills | Status: DC | PRN

## 2023-02-06 MED ORDER — HYDROCODONE-ACETAMINOPHEN 5-325 MG PO TABS: 1.0000 | ORAL_TABLET | Freq: Four times a day (QID) | ORAL | 0 refills | Status: DC | PRN

## 2023-02-06 NOTE — ED Provider Notes (Signed)
-----------------------------------------   2:45 AM on 02/06/2023 -----------------------------------------   [place]:   1. Heterogeneous uterine fibroids.  2. Moderate amount of fluid within the endometrial canal, with  additional findings likely consistent with blood products within the  endometrial and endocervical canals.   Updated patient on ultrasound results.  Denies active vaginal bleeding.  Pain improved, no complaints at this time.  Patient will follow-up closely with her GYN in [place].  Strict return precautions given.  Patient verbalizes understanding and agrees with plan of care.   Irean Hong, MD 02/06/23 [phone]

## 2023-02-06 NOTE — Discharge Instructions (Signed)
You may take pain and nausea medicine as needed.  Return to the ER for worsening symptoms, persistent vomiting, difficulty breathing or other concerns.

## 2023-02-27 IMAGING — CR DG LUMBAR SPINE COMPLETE 4+V
6 series · 6 of 6 positions shown · non-contrast
Comparison: None.

CLINICAL DATA: Low back pain after fall

EXAM:
LUMBAR SPINE - COMPLETE 4+ VIEW

[l-spine ap (1 of 2)]
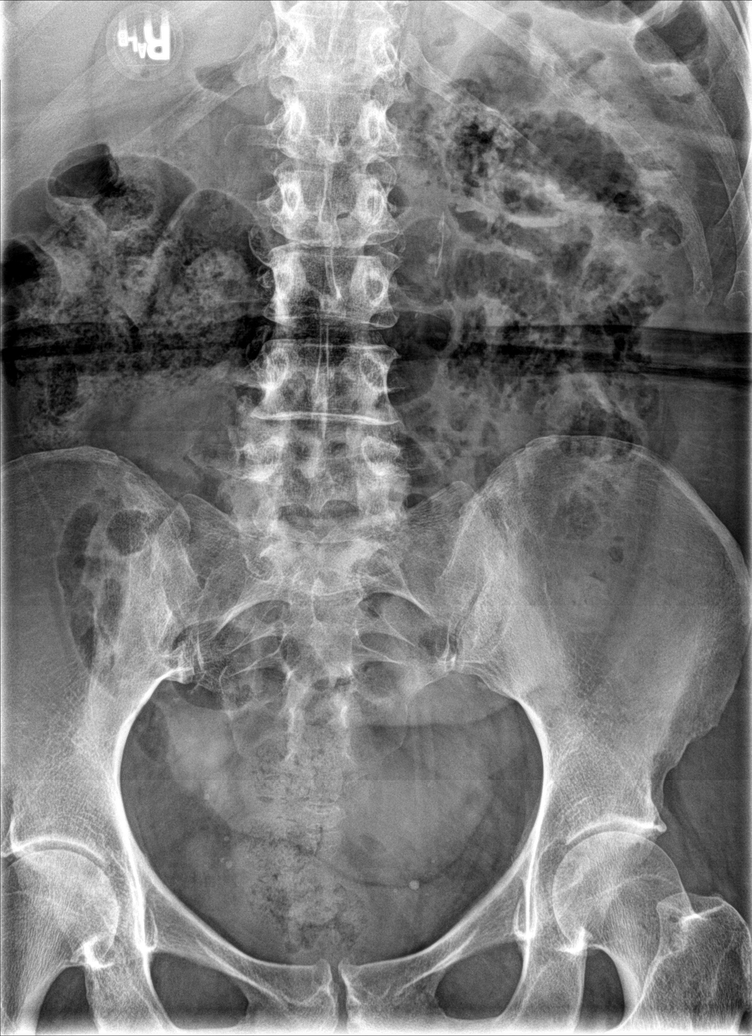

[l-spine obl (1 of 2)]
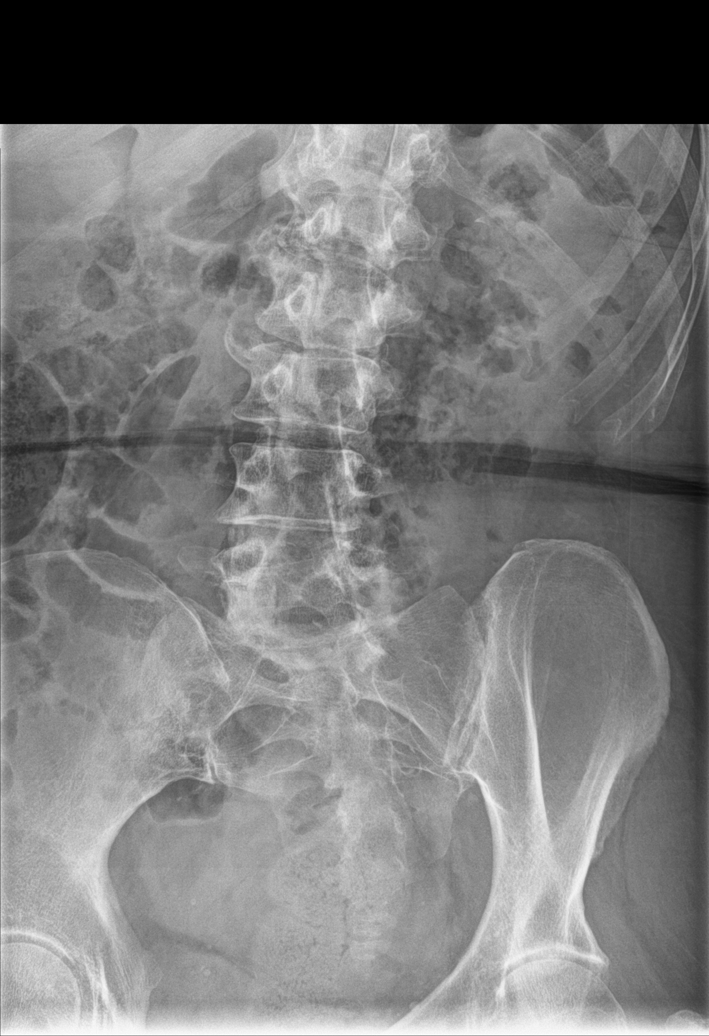

[l-spine obl (2 of 2)]
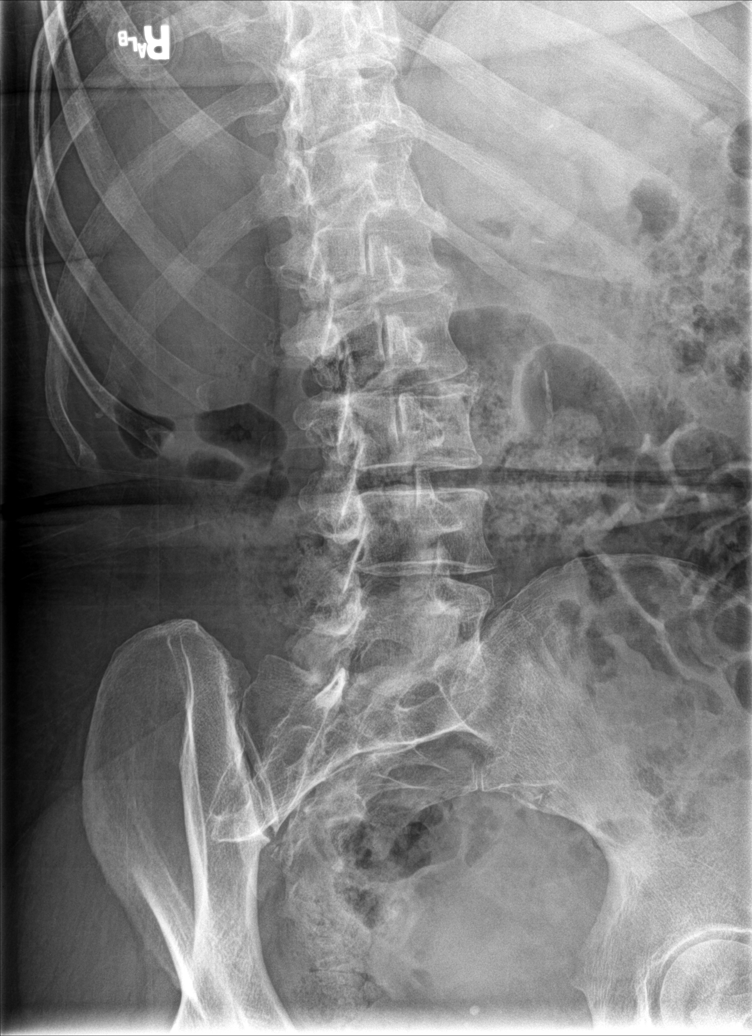

[l-spine lat]
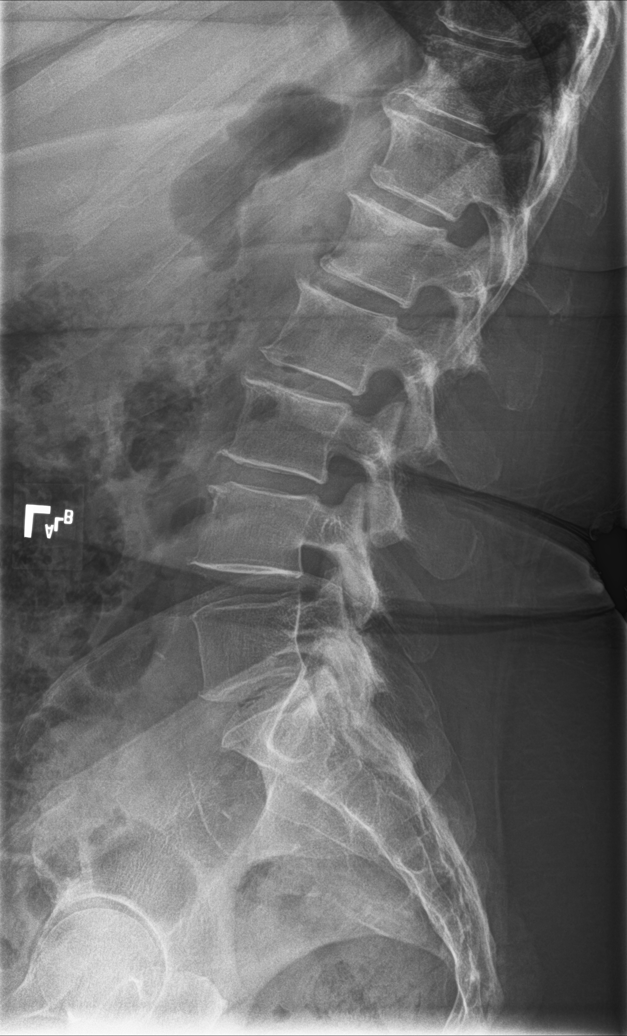

[l-spine spot]
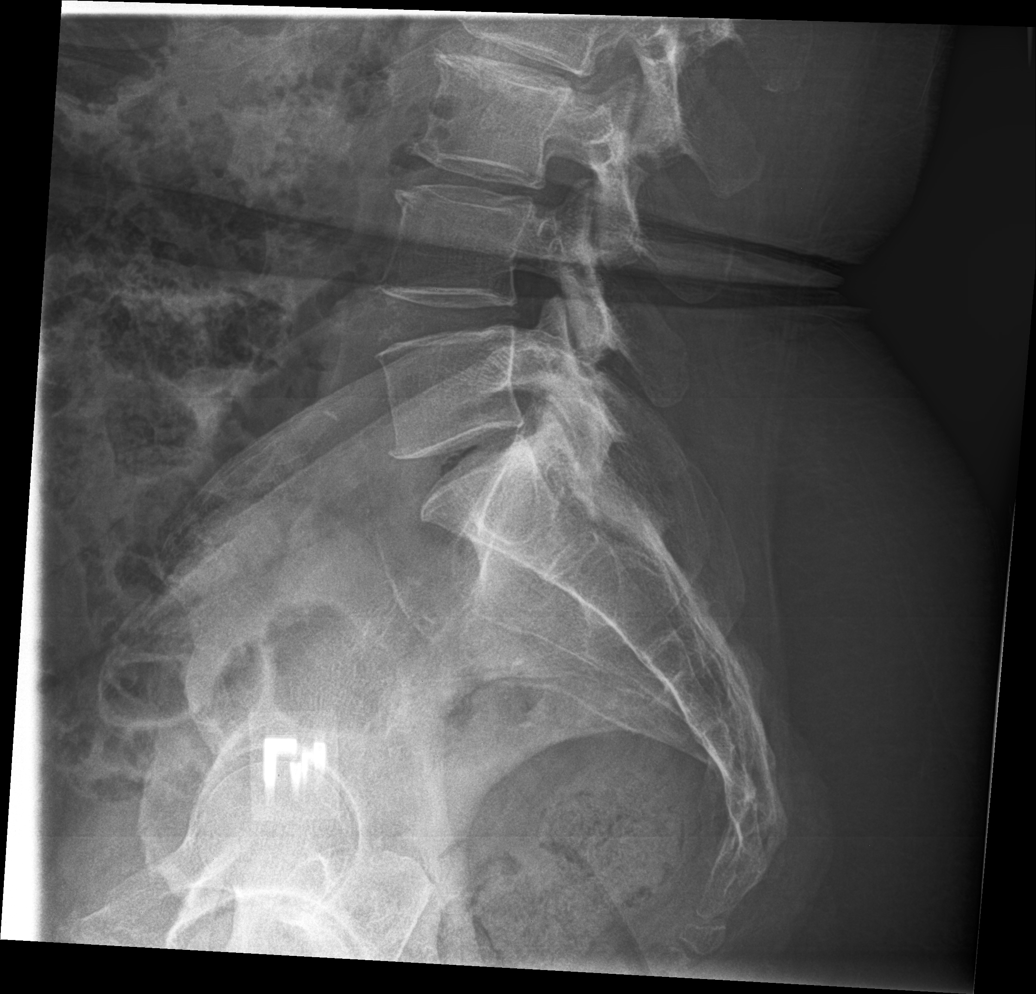

[l-spine ap (2 of 2)]
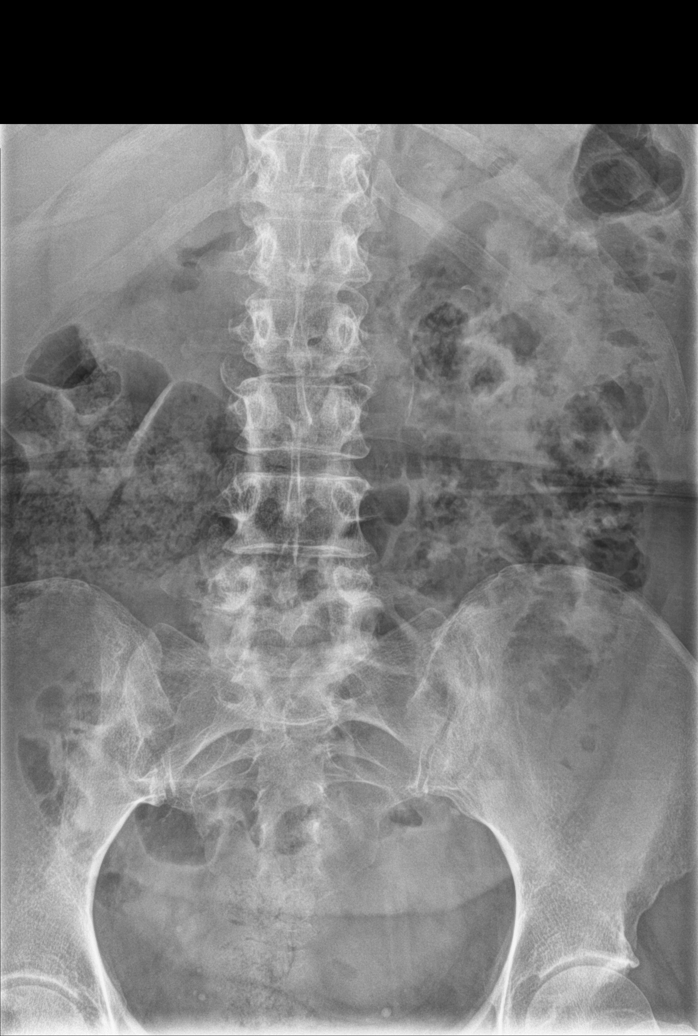

[6 of 6 positions shown; findings below may reference images not displayed]

FINDINGS: There is no evidence of lumbar spine fracture. Alignment is normal.
Multilevel degenerative changes, with osteophyte formation and facet
arthropathy, which is worse in the lower lumbar spine.
IMPRESSION: No acute fracture in the lumbar spine.

## 2023-02-27 IMAGING — CR DG WRIST COMPLETE 3+V*L*
5 series · 5 of 5 positions shown · non-contrast
Comparison: None.

CLINICAL DATA: Patient fell.  Wrist pain.

EXAM:
LEFT WRIST - COMPLETE 3+ VIEW

[wrist pa]
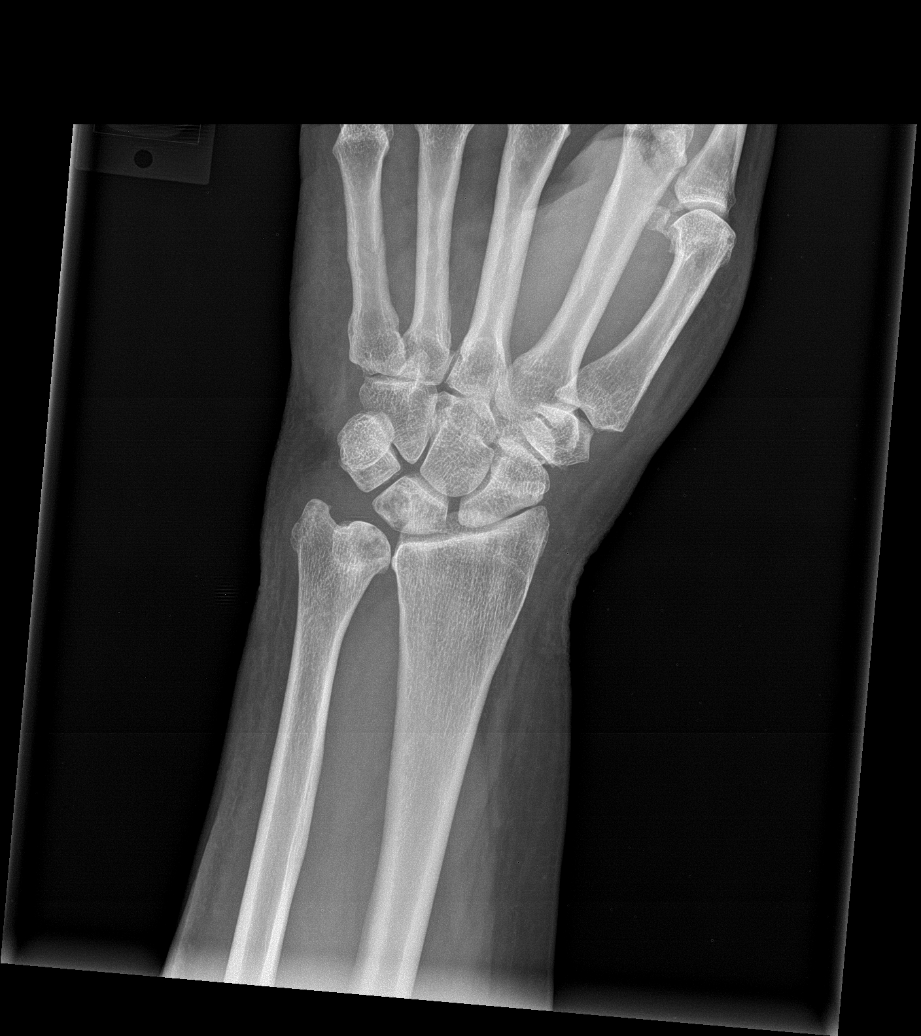

[wrist obl]
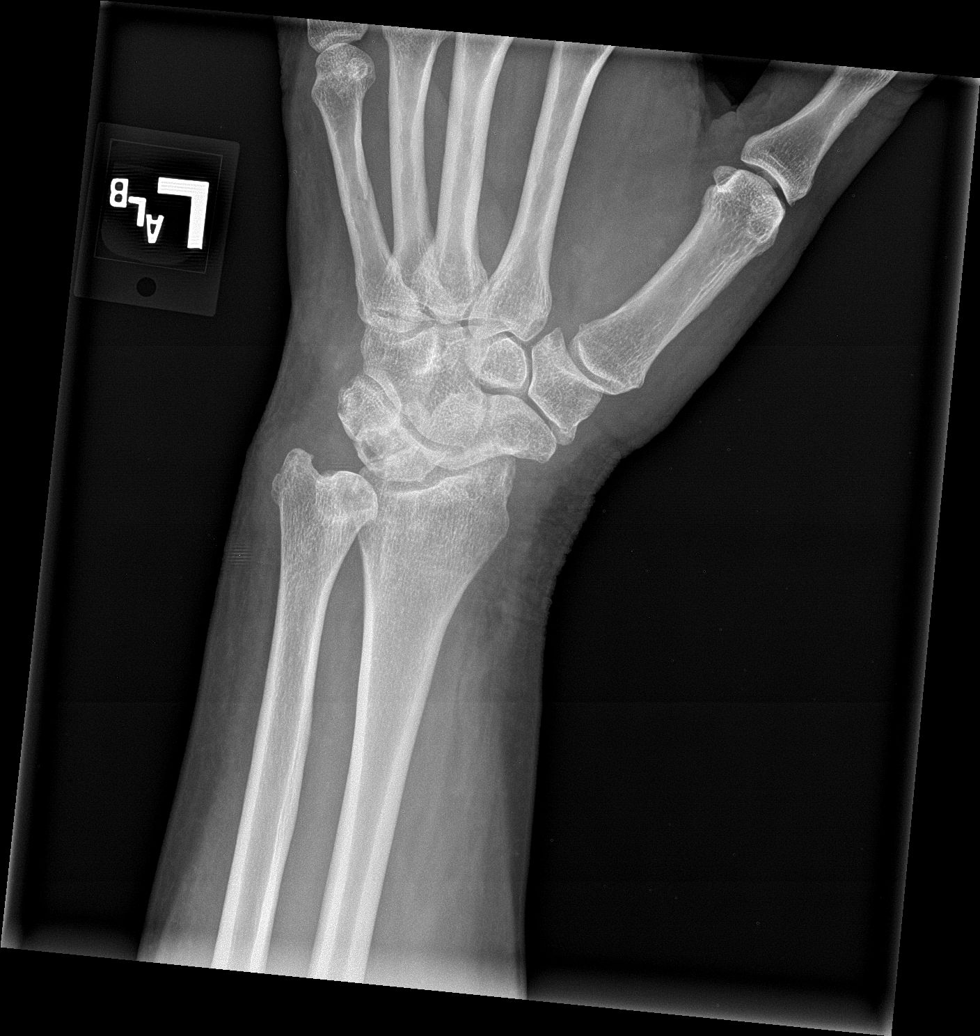

[wrist lat (1 of 2)]
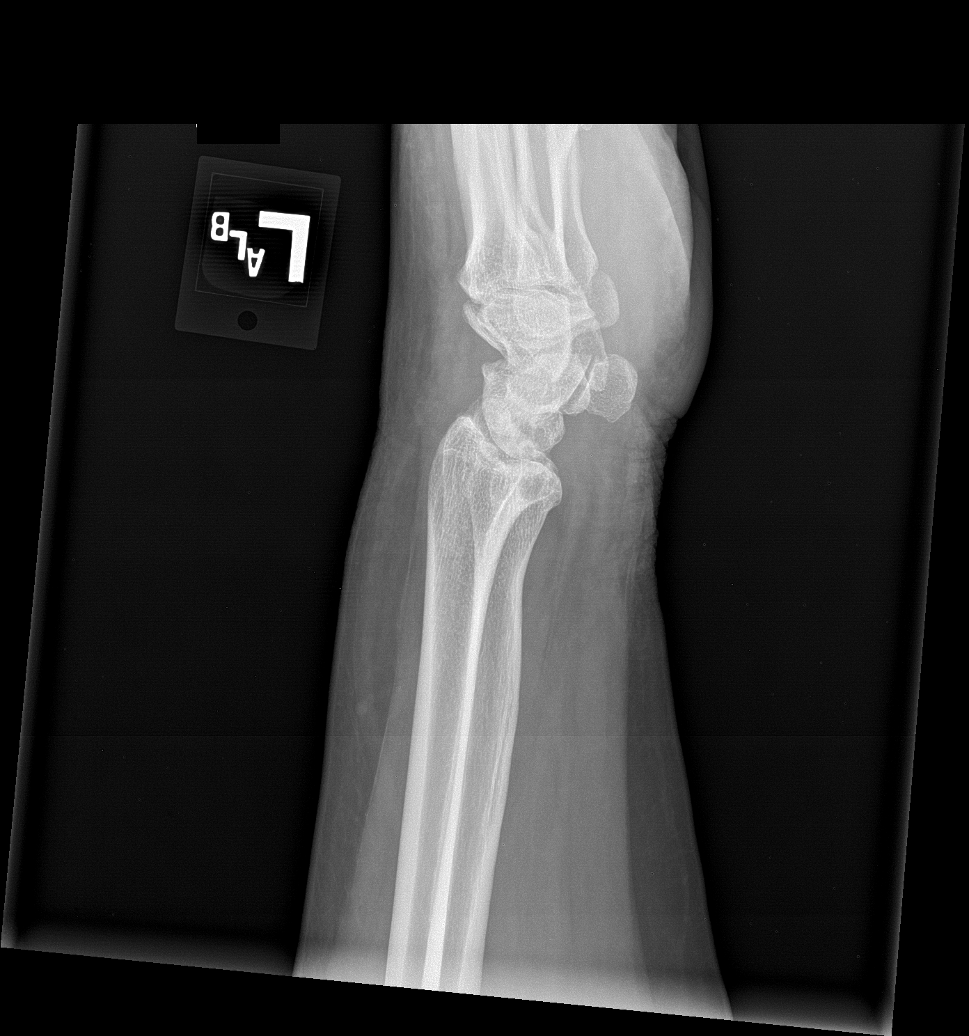

[wrist navicular]
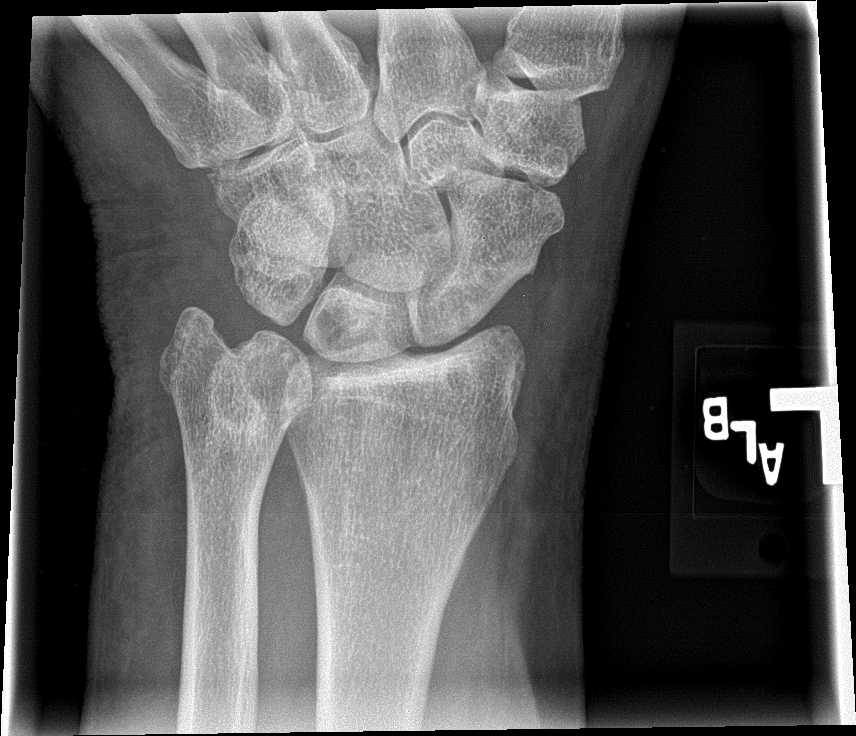

[wrist lat (2 of 2)]
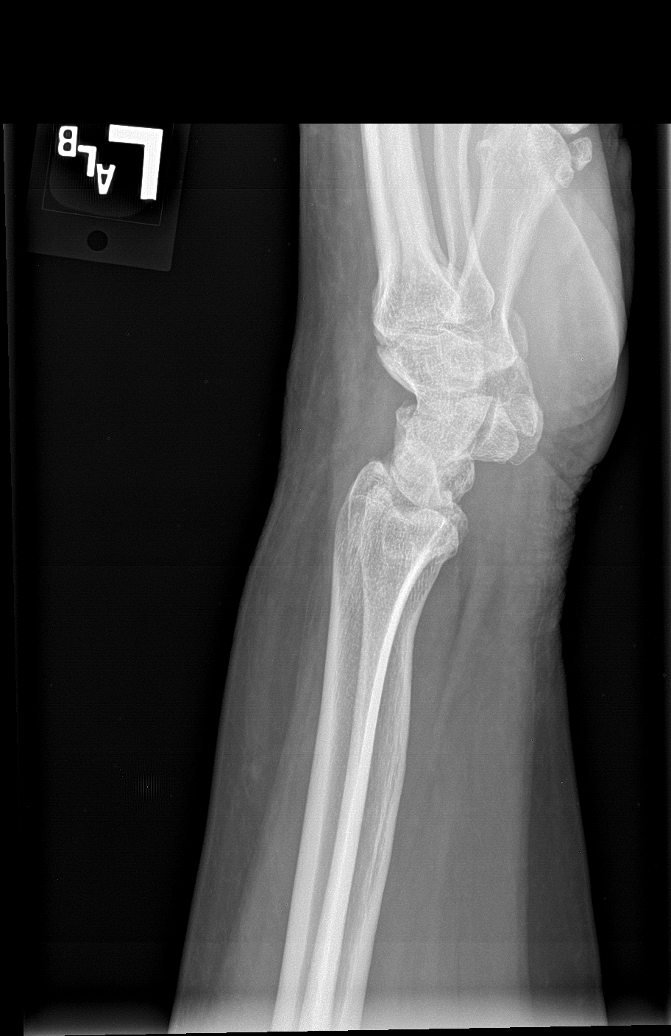

[5 of 5 positions shown; findings below may reference images not displayed]

FINDINGS: No evidence for an acute fracture. No subluxation or dislocation.
Degenerative changes are noted in the radiocarpal joint and distal
ulna with minimal widening of the scapholunate distance. No
worrisome lytic or sclerotic osseous abnormality.
IMPRESSION: 1. No acute bony abnormality.
2. Degenerative changes in the proximal wrist.

## 2023-05-28 ENCOUNTER — Ambulatory Visit
Admission: EM | Admit: 2023-05-28 | Discharge: 2023-05-28 | Disposition: A | Discharge status: Home / Self Care | Payer: 59 | Attending: Emergency Medicine | Admitting: Emergency Medicine

## 2023-05-28 ENCOUNTER — Encounter: Payer: Self-pay | Admitting: Emergency Medicine

## 2023-05-28 DIAGNOSIS — J069 Acute upper respiratory infection, unspecified: Secondary | ICD-10-CM | POA: Diagnosis not present

## 2023-05-28 LAB — GROUP A STREP BY PCR: Group A Strep by PCR: NOT DETECTED

## 2023-05-28 LAB — SARS CORONAVIRUS 2 BY RT PCR: SARS Coronavirus 2 by RT PCR: NEGATIVE

## 2023-05-28 MED ORDER — PROMETHAZINE-DM 6.25-15 MG/5ML PO SYRP: 5.0000 mL | ORAL_SOLUTION | Freq: Four times a day (QID) | ORAL | 0 refills | Status: DC | PRN

## 2023-05-28 MED ORDER — IPRATROPIUM BROMIDE 0.06 % NA SOLN: 2.0000 | Freq: Four times a day (QID) | NASAL | 12 refills | Status: DC

## 2023-05-28 MED ORDER — BENZONATATE 100 MG PO CAPS: 200.0000 mg | ORAL_CAPSULE | Freq: Three times a day (TID) | ORAL | 0 refills | Status: DC

## 2023-05-28 NOTE — Discharge Instructions (Addendum)
Your testing today was negative for COVID and strep.  I do believe you have a viral respiratory infection causing your symptoms.  Use over-the-counter Tylenol and/or ibuprofen according the package instructions as needed for any fever or pain.  Use the Atrovent nasal spray, 2 squirts in each nostril every 6 hours, as needed for runny nose and postnasal drip.  Use the Tessalon Perles every 8 hours during the day.  Take them with a small sip of water.  They may give you some numbness to the base of your tongue or a metallic taste in your mouth, this is normal.  Use the Promethazine DM cough syrup at bedtime for cough and congestion.  It will make you drowsy so do not take it during the day.  Return for reevaluation or see your primary care provider for any new or worsening symptoms.

## 2023-05-28 NOTE — ED Provider Notes (Signed)
[facility]    CSN: [id number] Arrival date & time: 05/28/23  1820      History   Chief Complaint Chief Complaint  Patient presents with   Cough   Generalized Body Aches   Sore Throat    HPI Elleny Zuver is a 57 y.o. female.   HPI  57 year old female with a past medical history significant for hypertension, morbid obesity, and kidney stones presents for evaluation of 3 days worth of cold symptoms which include fever with a Tmax of 100.6, body aches, nasal congestion, sore throat, cough is productive for clear sputum but no shortness of breath or wheezing.  She also denies GI complaints.  Past Medical History:  Diagnosis Date   History of kidney stones    Hypertension    Morbid obesity [facility])     Patient Active Problem List   Diagnosis Date Noted   Sepsis due to pneumonia (HCC) 09/09/2018    Past Surgical History:  Procedure Laterality Date   CHOLECYSTECTOMY     THYROIDECTOMY     TUBAL LIGATION      OB History   No obstetric history on file.      Home Medications    Prior to Admission medications   Medication Sig Start Date End Date Taking? Authorizing Provider  benzonatate (TESSALON) 100 MG capsule Take 2 capsules (200 mg total) by mouth every 8 (eight) hours. 05/28/23  Yes Becky Augusta, NP  ipratropium (ATROVENT) 0.06 % nasal spray Place 2 sprays into both nostrils 4 (four) times daily. 05/28/23  Yes Becky Augusta, NP  promethazine-dextromethorphan (PROMETHAZINE-DM) 6.25-15 MG/5ML syrup Take 5 mLs by mouth 4 (four) times daily as needed. 05/28/23  Yes Becky Augusta, NP  albuterol (VENTOLIN HFA) 108 (90 Base) MCG/ACT inhaler Inhale 1-2 puffs into the lungs every 6 (six) hours as needed for wheezing or shortness of breath. 11/22/22   Bailey Mech, NP  atorvastatin (LIPITOR) 40 MG tablet Take 40 mg by mouth daily. 03/22/18   [provider]  carvedilol (COREG) 25 MG tablet Take 25 mg by mouth 2 (two) times daily with a meal.  03/22/18   [provider]  dicyclomine (BENTYL) 20 MG tablet Take 1 tablet (20 mg total) by mouth 2 (two) times daily as needed (abdominal spasms/pain). 11/03/22   Sudie Grumbling, NP  hydrALAZINE (APRESOLINE) 25 MG tablet Take 25 mg by mouth 2 (two) times daily. 04/13/19   [provider]  HYDROcodone-acetaminophen (NORCO) 5-325 MG tablet Take 1 tablet by mouth every 6 (six) hours as needed for moderate pain. 02/06/23   Irean Hong, MD  levothyroxine (SYNTHROID, LEVOTHROID) 100 MCG tablet Take 1 tablet (100 mcg total) by mouth every morning. 09/13/18   Salary, Evelena Asa, MD  loratadine (CLARITIN) 10 MG tablet Take 1 tablet (10 mg total) by mouth daily. 09/12/18   Salary, Evelena Asa, MD  olmesartan (BENICAR) 40 MG tablet Take 40 mg by mouth daily. 03/24/19   [provider]  ondansetron (ZOFRAN) 4 MG tablet Take 1 tablet (4 mg total) by mouth every 8 (eight) hours as needed for nausea or vomiting. 11/03/22   Amyot, Ali Lowe, NP  ondansetron (ZOFRAN-ODT) 4 MG disintegrating tablet Take 1 tablet (4 mg total) by mouth every 8 (eight) hours as needed for nausea or vomiting. 02/06/23   Irean Hong, MD  spironolactone (ALDACTONE) 25 MG tablet Take by mouth. 03/16/19 05/31/21  [provider]  amLODipine (NORVASC) 5 MG tablet Take 1 tablet (5  mg total) by mouth daily. 09/12/18 03/15/19  Salary, Evelena Asa, MD  famotidine (PEPCID) 20 MG tablet Take 1 tablet (20 mg total) by mouth 2 (two) times daily for 10 days. 03/15/19 04/23/19  Menshew, Charlesetta Ivory, PA-C  lisinopril-hydrochlorothiazide (PRINZIDE,ZESTORETIC) 20-12.5 MG tablet Take 1 tablet by mouth daily. 03/22/18 04/23/19  [provider]  potassium chloride (KLOR-CON) 20 MEQ packet Take 20 mEq by mouth daily. 09/12/18 03/15/19  Salary, Evelena Asa, MD    Family History History reviewed. No pertinent family history.  Social History Social History   Tobacco Use   Smoking status: Never   Smokeless tobacco: Never  Vaping Use    Vaping status: Never Used  Substance Use Topics   Alcohol use: No   Drug use: No     Allergies   Amlodipine, Doxycycline, Lisinopril, and Penicillins   Review of Systems Review of Systems  Constitutional:  Positive for fever.  HENT:  Positive for congestion, rhinorrhea and sore throat. Negative for ear pain.   Respiratory:  Positive for cough. Negative for shortness of breath and wheezing.   Gastrointestinal:  Negative for diarrhea, nausea and vomiting.  Musculoskeletal:  Positive for arthralgias and myalgias.     Physical Exam Triage Vital Signs ED Triage Vitals  Encounter Vitals Group     BP      Systolic BP Percentile      Diastolic BP Percentile      Pulse      Resp      Temp      Temp src      SpO2      Weight      Height      Head Circumference      Peak Flow      Pain Score      Pain Loc      Pain Education      Exclude from Growth Chart    No data found.  Updated Vital Signs BP (!) 201/109 (BP Location: Left Arm)   Pulse 74   Temp 98.6 F (37 C) (Oral)   Resp 15   Ht 5\' 9"  (1.753 m)   Wt 266 lb 12.1 oz (121 kg)   SpO2 95%   BMI 39.39 kg/m   Visual Acuity Right Eye Distance:   Left Eye Distance:   Bilateral Distance:    Right Eye Near:   Left Eye Near:    Bilateral Near:     Physical Exam Vitals and nursing note reviewed.  Constitutional:      Appearance: Normal appearance. She is not ill-appearing.  HENT:     Head: Normocephalic and atraumatic.     Right Ear: Tympanic membrane, ear canal and external ear normal. There is no impacted cerumen.     Left Ear: Tympanic membrane, ear canal and external ear normal. There is no impacted cerumen.     Nose: Congestion and rhinorrhea present.     Comments: Nasal mucosa is edematous and mildly erythematous with clear discharge in both nares.    Mouth/Throat:     Mouth: Mucous membranes are moist.     Pharynx: Oropharynx is clear. Posterior oropharyngeal erythema present. No oropharyngeal  exudate.     Comments: Mild erythema to the posterior oropharynx with clear postnasal drip. Cardiovascular:     Rate and Rhythm: Normal rate and regular rhythm.     Pulses: Normal pulses.     Heart sounds: Normal heart sounds. No murmur heard.    No friction  rub. No gallop.  Pulmonary:     Effort: Pulmonary effort is normal.     Breath sounds: Normal breath sounds. No wheezing, rhonchi or rales.  Musculoskeletal:     Cervical back: Normal range of motion and neck supple.  Lymphadenopathy:     Cervical: No cervical adenopathy.  Skin:    General: Skin is warm and dry.     Capillary Refill: Capillary refill takes less than 2 seconds.     Findings: No rash.  Neurological:     General: No focal deficit present.     Mental Status: She is alert and oriented to person, place, and time.      UC Treatments / Results  Labs (all labs ordered are listed, but only abnormal results are displayed) Labs Reviewed  GROUP A STREP BY PCR  SARS CORONAVIRUS 2 BY RT PCR    EKG   Radiology No results found.  Procedures Procedures (including critical care time)  Medications Ordered in UC Medications - No data to display  Initial Impression / Assessment and Plan / UC Course  I have reviewed the triage vital signs and the nursing notes.  Pertinent labs & imaging results that were available during my care of the patient were reviewed by me and considered in my medical decision making (see chart for details).   Patient is a nontoxic-appearing 57 year old female presenting for evaluation of 3 days worth of COVID/flulike symptoms as outlined HPI above.  She is also endorsing a sore throat.  Patient is able speak in full sentence without dyspnea or tachypnea and her lung sounds are clear to auscultation all fields.  Vital signs are reassuring as her respiratory rate at triage was 15 with a 95% room air oxygen saturation.  She is afebrile with an oral temp of 98.6.  I will order a COVID PCR given  that her symptoms have been going on for 3 days and she is outside the therapeutic window for Tamiflu.  I will also order a strep PCR given that she has been experiencing a sore throat.  Strep PCR is negative.  COVID PCR is negative.  I will discharge patient home with a diagnosis of viral URI with a cough with prescription for Atrovent nasal spray, Tessalon Perles, and Promethazine DM cough syrup.  She can use over-the-counter Tylenol and/or ibuprofen as needed for fever or pain.   Final Clinical Impressions(s) / UC Diagnoses   Final diagnoses:  Viral URI with cough     Discharge Instructions      Your testing today was negative for COVID and strep.  I do believe you have a viral respiratory infection causing your symptoms.  Use over-the-counter Tylenol and/or ibuprofen according the package instructions as needed for any fever or pain.  Use the Atrovent nasal spray, 2 squirts in each nostril every 6 hours, as needed for runny nose and postnasal drip.  Use the Tessalon Perles every 8 hours during the day.  Take them with a small sip of water.  They may give you some numbness to the base of your tongue or a metallic taste in your mouth, this is normal.  Use the Promethazine DM cough syrup at bedtime for cough and congestion.  It will make you drowsy so do not take it during the day.  Return for reevaluation or see your primary care provider for any new or worsening symptoms.      ED Prescriptions     Medication Sig Dispense Auth. Provider  benzonatate (TESSALON) 100 MG capsule Take 2 capsules (200 mg total) by mouth every 8 (eight) hours. 21 capsule Becky Augusta, NP   ipratropium (ATROVENT) 0.06 % nasal spray Place 2 sprays into both nostrils 4 (four) times daily. 15 mL Becky Augusta, NP   promethazine-dextromethorphan (PROMETHAZINE-DM) 6.25-15 MG/5ML syrup Take 5 mLs by mouth 4 (four) times daily as needed. 118 mL Becky Augusta, NP      PDMP not reviewed this encounter.    Becky Augusta, NP 05/28/23 1919

## 2023-05-28 NOTE — ED Triage Notes (Signed)
Patient c/o sore throat, cough, bodyaches, fever, and nasal congestion that started on Tuesday.

## 2023-06-15 DIAGNOSIS — E782 Mixed hyperlipidemia: Secondary | ICD-10-CM | POA: Diagnosis not present

## 2023-06-15 DIAGNOSIS — M1712 Unilateral primary osteoarthritis, left knee: Secondary | ICD-10-CM | POA: Diagnosis not present

## 2023-06-15 DIAGNOSIS — Z23 Encounter for immunization: Secondary | ICD-10-CM | POA: Diagnosis not present

## 2023-06-15 DIAGNOSIS — E039 Hypothyroidism, unspecified: Secondary | ICD-10-CM | POA: Diagnosis not present

## 2023-06-15 DIAGNOSIS — Z1231 Encounter for screening mammogram for malignant neoplasm of breast: Secondary | ICD-10-CM | POA: Diagnosis not present

## 2023-06-15 DIAGNOSIS — I10 Essential (primary) hypertension: Secondary | ICD-10-CM | POA: Diagnosis not present

## 2023-06-15 DIAGNOSIS — Z6837 Body mass index (BMI) 37.0-37.9, adult: Secondary | ICD-10-CM | POA: Diagnosis not present

## 2023-06-15 DIAGNOSIS — E6609 Other obesity due to excess calories: Secondary | ICD-10-CM | POA: Diagnosis not present

## 2023-06-15 DIAGNOSIS — N950 Postmenopausal bleeding: Secondary | ICD-10-CM | POA: Diagnosis not present

## 2023-09-13 DIAGNOSIS — J069 Acute upper respiratory infection, unspecified: Secondary | ICD-10-CM | POA: Diagnosis not present

## 2023-09-13 DIAGNOSIS — R7303 Prediabetes: Secondary | ICD-10-CM | POA: Diagnosis not present

## 2023-09-13 DIAGNOSIS — Z1231 Encounter for screening mammogram for malignant neoplasm of breast: Secondary | ICD-10-CM | POA: Diagnosis not present

## 2023-09-13 DIAGNOSIS — E6609 Other obesity due to excess calories: Secondary | ICD-10-CM | POA: Diagnosis not present

## 2023-09-13 DIAGNOSIS — Z6837 Body mass index (BMI) 37.0-37.9, adult: Secondary | ICD-10-CM | POA: Diagnosis not present

## 2023-09-13 DIAGNOSIS — E039 Hypothyroidism, unspecified: Secondary | ICD-10-CM | POA: Diagnosis not present

## 2023-09-13 DIAGNOSIS — E782 Mixed hyperlipidemia: Secondary | ICD-10-CM | POA: Diagnosis not present

## 2023-09-13 DIAGNOSIS — I10 Essential (primary) hypertension: Secondary | ICD-10-CM | POA: Diagnosis not present

## 2023-10-25 DIAGNOSIS — R102 Pelvic and perineal pain: Secondary | ICD-10-CM | POA: Diagnosis not present

## 2023-10-25 DIAGNOSIS — N950 Postmenopausal bleeding: Secondary | ICD-10-CM | POA: Diagnosis not present

## 2023-11-22 DIAGNOSIS — Z6836 Body mass index (BMI) 36.0-36.9, adult: Secondary | ICD-10-CM | POA: Diagnosis not present

## 2023-11-22 DIAGNOSIS — I10 Essential (primary) hypertension: Secondary | ICD-10-CM | POA: Diagnosis not present

## 2023-11-22 DIAGNOSIS — Z23 Encounter for immunization: Secondary | ICD-10-CM | POA: Diagnosis not present

## 2023-11-22 DIAGNOSIS — E6609 Other obesity due to excess calories: Secondary | ICD-10-CM | POA: Diagnosis not present

## 2023-11-22 DIAGNOSIS — Z1231 Encounter for screening mammogram for malignant neoplasm of breast: Secondary | ICD-10-CM | POA: Diagnosis not present

## 2023-11-22 DIAGNOSIS — E039 Hypothyroidism, unspecified: Secondary | ICD-10-CM | POA: Diagnosis not present

## 2023-11-22 DIAGNOSIS — E782 Mixed hyperlipidemia: Secondary | ICD-10-CM | POA: Diagnosis not present

## 2023-12-07 DIAGNOSIS — R9389 Abnormal findings on diagnostic imaging of other specified body structures: Secondary | ICD-10-CM | POA: Diagnosis not present

## 2023-12-07 DIAGNOSIS — N858 Other specified noninflammatory disorders of uterus: Secondary | ICD-10-CM | POA: Diagnosis not present

## 2023-12-07 DIAGNOSIS — N950 Postmenopausal bleeding: Secondary | ICD-10-CM | POA: Diagnosis not present

## 2024-01-17 DIAGNOSIS — Z1231 Encounter for screening mammogram for malignant neoplasm of breast: Secondary | ICD-10-CM | POA: Diagnosis not present

## 2024-01-27 ENCOUNTER — Ambulatory Visit
Admission: EM | Admit: 2024-01-27 | Discharge: 2024-01-27 | Disposition: A | Discharge status: Home / Self Care | Attending: Family Medicine | Admitting: Family Medicine

## 2024-01-27 DIAGNOSIS — M5416 Radiculopathy, lumbar region: Secondary | ICD-10-CM | POA: Diagnosis not present

## 2024-01-27 MED ORDER — PREDNISONE 10 MG (21) PO TBPK: ORAL_TABLET | Freq: Every day | ORAL | 0 refills | Status: DC

## 2024-01-27 MED ORDER — METHOCARBAMOL 500 MG PO TABS: 500.0000 mg | ORAL_TABLET | Freq: Two times a day (BID) | ORAL | 0 refills | Status: AC

## 2024-01-27 MED ORDER — DEXAMETHASONE SODIUM PHOSPHATE 10 MG/ML IJ SOLN
10.0000 mg | Freq: Once | INTRAMUSCULAR | Status: AC
  Administered 2024-01-27: 10 mg via INTRAMUSCULAR

## 2024-01-27 NOTE — ED Triage Notes (Signed)
 Pt c/o lower back pain x1-2weeks  Pt states that it has gotten worse over the last 2 days and she can not ride in a car due to back soreness.  Pt denies any urinary issues  Pt believes that it is her sciatica

## 2024-01-27 NOTE — ED Provider Notes (Signed)
 [facility]    CSN: [id number] Arrival date & time: 01/27/24  1848      History   Chief Complaint Chief Complaint  Patient presents with   Sciatica    HPI  HPI Kayla Christensen is a 58 y.o. female bilateral lower back pain that radiates down the right leg for the past week but worse in the past few days. Riding in the car and movement makes the pain worse. She is not resting well at night due to the pain. 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 Took Tylenol , ibuprofen, ice packs and lidocaine  patches with some short-term relief.  Pain was intermittent but now its constant. Has history of sciatic and symptoms feel similar. No saddle paresthesia, bowel or bladder incontinence. Denies fever, dysuria or urinary symptoms.       Past Medical History:  Diagnosis Date   History of kidney stones    Hypertension    Morbid obesity [facility])     Patient Active Problem List   Diagnosis Date Noted   Sepsis due to pneumonia (HCC) 09/09/2018    Past Surgical History:  Procedure Laterality Date   CHOLECYSTECTOMY     THYROIDECTOMY     TUBAL LIGATION      OB History   No obstetric history on file.      Home Medications    Prior to Admission medications   Medication Sig Start Date End Date Taking? Authorizing Provider  albuterol  (VENTOLIN  HFA) 108 (90 Base) MCG/ACT inhaler Inhale 1-2 puffs into the lungs every 6 (six) hours as needed for wheezing or shortness of breath. 11/22/22  Yes Morene Morrow, NP  atorvastatin  (LIPITOR) 40 MG tablet Take 40 mg by mouth daily. 03/22/18  Yes [provider]  carvedilol  (COREG ) 25 MG tablet Take 25 mg by mouth 2 (two) times daily with a meal. 03/22/18  Yes [provider]  hydrALAZINE  (APRESOLINE ) 25 MG tablet Take 25 mg by mouth 2 (two) times daily. 04/13/19  Yes [provider]  levothyroxine  (SYNTHROID , LEVOTHROID) 100 MCG tablet Take 1 tablet (100 mcg total) by mouth every morning. 09/13/18   Yes Salary, Nemiah BIRCH, MD  loratadine  (CLARITIN ) 10 MG tablet Take 1 tablet (10 mg total) by mouth daily. 09/12/18  Yes Salary, Nemiah BIRCH, MD  methocarbamol  (ROBAXIN ) 500 MG tablet Take 1 tablet (500 mg total) by mouth 2 (two) times daily. 01/27/24  Yes Nalea Salce, DO  olmesartan (BENICAR) 40 MG tablet Take 40 mg by mouth daily. 03/24/19  Yes [provider]  predniSONE  (STERAPRED UNI-PAK 21 TAB) 10 MG (21) TBPK tablet Take by mouth daily. Take 6 tabs by mouth daily for 1, then 5 tabs for 1 day, then 4 tabs for 1 day, then 3 tabs for 1 day, then 2 tabs for 1 day, then 1 tab for 1 day. 01/28/24  Yes Rainah Kirshner, DO  benzonatate  (TESSALON ) 100 MG capsule Take 2 capsules (200 mg total) by mouth every 8 (eight) hours. 05/28/23   Bernardino Ditch, NP  dicyclomine  (BENTYL ) 20 MG tablet Take 1 tablet (20 mg total) by mouth 2 (two) times daily as needed (abdominal spasms/pain). 11/03/22   Pearl Jenkins Lesches, NP  HYDROcodone -acetaminophen  (NORCO) 5-325 MG tablet Take 1 tablet by mouth every 6 (six) hours as needed for moderate pain. 02/06/23   Sung, Jade J, MD  ipratropium (ATROVENT ) 0.06 % nasal spray Place 2 sprays into both nostrils 4 (four) times daily. 05/28/23   Bernardino,  Venetia, NP  ondansetron  (ZOFRAN ) 4 MG tablet Take 1 tablet (4 mg total) by mouth every 8 (eight) hours as needed for nausea or vomiting. 11/03/22   Amyot, Jenkins Lesches, NP  ondansetron  (ZOFRAN -ODT) 4 MG disintegrating tablet Take 1 tablet (4 mg total) by mouth every 8 (eight) hours as needed for nausea or vomiting. 02/06/23   Sung, Jade J, MD  promethazine -dextromethorphan (PROMETHAZINE -DM) 6.25-15 MG/5ML syrup Take 5 mLs by mouth 4 (four) times daily as needed. 05/28/23   Bernardino Venetia, NP  spironolactone (ALDACTONE) 25 MG tablet Take by mouth. 03/16/19 05/31/21  [provider]  amLODipine  (NORVASC ) 5 MG tablet Take 1 tablet (5 mg total) by mouth daily. 09/12/18 03/15/19  Salary, Nemiah BIRCH, MD  famotidine  (PEPCID ) 20 MG tablet Take 1  tablet (20 mg total) by mouth 2 (two) times daily for 10 days. 03/15/19 04/23/19  Menshew, Candida LULLA Kings, PA-C  lisinopril -hydrochlorothiazide  (PRINZIDE ,ZESTORETIC ) 20-12.5 MG tablet Take 1 tablet by mouth daily. 03/22/18 04/23/19  [provider]  potassium chloride  (KLOR-CON ) 20 MEQ packet Take 20 mEq by mouth daily. 09/12/18 03/15/19  Salary, Nemiah BIRCH, MD    Family History History reviewed. No pertinent family history.  Social History Social History   Tobacco Use   Smoking status: Never   Smokeless tobacco: Never  Vaping Use   Vaping status: Never Used  Substance Use Topics   Alcohol use: No   Drug use: No     Allergies   Lisinopril -hydrochlorothiazide , Amlodipine , Doxycycline , Lisinopril , and Penicillins   Review of Systems Review of Systems: egative unless otherwise stated in HPI.      Physical Exam Triage Vital Signs ED Triage Vitals [01/27/24 1901]  Encounter Vitals Group     BP      Girls Systolic BP Percentile      Girls Diastolic BP Percentile      Boys Systolic BP Percentile      Boys Diastolic BP Percentile      Pulse      Resp      Temp      Temp src      SpO2      Weight 240 lb (108.9 kg)     Height 5' 10 (1.778 m)     Head Circumference      Peak Flow      Pain Score 10     Pain Loc      Pain Education      Exclude from Growth Chart    No data found.  Updated Vital Signs BP (!) 175/97 (BP Location: Left Arm)   Pulse 60   Temp 98.6 F (37 C) (Oral)   Resp 16   Ht 5' 10 (1.778 m)   Wt 108.9 kg   SpO2 94%   BMI 34.44 kg/m   Visual Acuity Right Eye Distance:   Left Eye Distance:   Bilateral Distance:    Right Eye Near:   Left Eye Near:    Bilateral Near:     Physical Exam GEN: well appearing female in no acute distress  CVS: well perfused  RESP: speaking in full sentences without pause, no respiratory distress  MSK:  Lumbar spine: - Inspection: no gross deformity or asymmetry, swelling or ecchymosis. No skin changes   - Palpation: No TTP over the spinous processes, bilateral lumbar paraspinal muscles hypertonicity, no SI joint tenderness bilaterally - ROM: Good active ROM of the lumbar spine in flexion and extension but with mild pain  - Strength: 5/5  strength of lower extremity in L4-S1 nerve root distributions b/l - Neuro: sensation intact in the L4-S1 nerve root distribution b/l, 2+ L4 and S1 reflexes - Special testing: Negative straight leg raise SKIN: warm, dry, no overly skin rash or erythema    UC Treatments / Results  Labs (all labs ordered are listed, but only abnormal results are displayed) Labs Reviewed - No data to display  EKG   Radiology No results found.    Procedures Procedures (including critical care time)  Medications Ordered in UC Medications  dexamethasone  (DECADRON ) injection 10 mg (10 mg Intramuscular Given 01/27/24 1941)    Initial Impression / Assessment and Plan / UC Course  I have reviewed the triage vital signs and the nursing notes.  Pertinent labs & imaging results that were available during my care of the patient were reviewed by me and considered in my medical decision making (see chart for details).      Pt is a 58 y.o.  female with a week of acute on chronic lower back pain.    Kayla Christensen is hypertensive here at 175/97.  States she is due to take her blood pressure medication.  Prescribed Hydralazine , Coreg ,omlesartan and spirnolactone.  Denies needing any refills.  Advised patient to take her blood pressure medication when she gets home.  Stressed the importance of daily adherence and she reports taking her blood pressures on a regular basis.  Lumbar x-ray from August 2022 showed multilevel degenerative changes, with osteophyte formation and facet arthropathy, which is worse in the lower lumbar spine.  Repeat lumbar plain films deferred for today as this feels like her chronic pain.  She has no midline tenderness concerning for possible compression or facet  fracture.  Discussed p.o. versus IM steroids and she is agreeable to both.  Decadron  10 mg IM given.  Patient to gradually return to normal activities, as tolerated and continue ordinary activities within the limits permitted by pain. Prescribed prednisone  taper to start tomorrow and muscle relaxer  for pain relief. Tylenol  and Lidocaine  patches PRN for multimodal pain relief. Counseled patient on red flag symptoms and when to seek immediate care.  No red flags suggesting cauda equina syndrome or progressive major motor weakness. Patient to follow up with orthopedic provider if symptoms do not improve with conservative treatment.  Return and ED precautions given.    Discussed MDM, treatment plan and plan for follow-up with patient who agrees with plan.   Final Clinical Impressions(s) / UC Diagnoses   Final diagnoses:  Right lumbar radiculitis     Discharge Instructions      If medication was prescribed, stop by the pharmacy to pick up your prescriptions.  For your back pain, Take 1500 mg Tylenol  twice a day, take muscle relaxer (Robaxin /methocarbomal) twice a day,  as needed for pain. Continue using Lidocaine  patches. Apply for 12 hours and then remove.   Watch for worsening symptoms such as an increasing weakness or loss of sensation in your arms or legs, increasing pain and/or the loss of bladder or bowel function. Should any of these occur, go to the emergency department immediately.       ED Prescriptions     Medication Sig Dispense Auth. Provider   predniSONE  (STERAPRED UNI-PAK 21 TAB) 10 MG (21) TBPK tablet Take by mouth daily. Take 6 tabs by mouth daily for 1, then 5 tabs for 1 day, then 4 tabs for 1 day, then 3 tabs for 1 day, then 2 tabs for 1 day, then  1 tab for 1 day. 21 tablet Andruw Battie, DO   methocarbamol  (ROBAXIN ) 500 MG tablet Take 1 tablet (500 mg total) by mouth 2 (two) times daily. 20 tablet Jenasia Dolinar, DO      PDMP not reviewed this encounter.    Benisha Hadaway, DO 02/03/24 [phone]

## 2024-01-27 NOTE — Discharge Instructions (Addendum)
 If medication was prescribed, stop by the pharmacy to pick up your prescriptions.  For your back pain, Take 1500 mg Tylenol  twice a day, take muscle relaxer (Robaxin /methocarbomal) twice a day,  as needed for pain. Continue using Lidocaine  patches. Apply for 12 hours and then remove.   Watch for worsening symptoms such as an increasing weakness or loss of sensation in your arms or legs, increasing pain and/or the loss of bladder or bowel function. Should any of these occur, go to the emergency department immediately.

## 2024-03-01 DIAGNOSIS — I10 Essential (primary) hypertension: Secondary | ICD-10-CM | POA: Diagnosis not present

## 2024-03-01 DIAGNOSIS — T148XXA Other injury of unspecified body region, initial encounter: Secondary | ICD-10-CM | POA: Diagnosis not present

## 2024-03-01 DIAGNOSIS — E6609 Other obesity due to excess calories: Secondary | ICD-10-CM | POA: Diagnosis not present

## 2024-03-01 DIAGNOSIS — Z6834 Body mass index (BMI) 34.0-34.9, adult: Secondary | ICD-10-CM | POA: Diagnosis not present

## 2024-03-01 DIAGNOSIS — L089 Local infection of the skin and subcutaneous tissue, unspecified: Secondary | ICD-10-CM | POA: Diagnosis not present

## 2024-03-01 DIAGNOSIS — M1712 Unilateral primary osteoarthritis, left knee: Secondary | ICD-10-CM | POA: Diagnosis not present

## 2024-03-01 DIAGNOSIS — E039 Hypothyroidism, unspecified: Secondary | ICD-10-CM | POA: Diagnosis not present

## 2024-03-01 DIAGNOSIS — R7303 Prediabetes: Secondary | ICD-10-CM | POA: Diagnosis not present

## 2024-03-16 DIAGNOSIS — D259 Leiomyoma of uterus, unspecified: Secondary | ICD-10-CM | POA: Diagnosis not present

## 2024-03-16 DIAGNOSIS — N939 Abnormal uterine and vaginal bleeding, unspecified: Secondary | ICD-10-CM | POA: Diagnosis not present

## 2024-04-04 ENCOUNTER — Ambulatory Visit (INDEPENDENT_AMBULATORY_CARE_PROVIDER_SITE_OTHER)

## 2024-04-04 ENCOUNTER — Ambulatory Visit
Admission: EM | Admit: 2024-04-04 | Discharge: 2024-04-04 | Disposition: A | Discharge status: Home / Self Care | Attending: Emergency Medicine | Admitting: Emergency Medicine

## 2024-04-04 ENCOUNTER — Ambulatory Visit: Payer: Self-pay | Admitting: Emergency Medicine

## 2024-04-04 DIAGNOSIS — J683 Other acute and subacute respiratory conditions due to chemicals, gases, fumes and vapors: Secondary | ICD-10-CM | POA: Diagnosis not present

## 2024-04-04 DIAGNOSIS — U071 COVID-19: Secondary | ICD-10-CM | POA: Insufficient documentation

## 2024-04-04 DIAGNOSIS — I10 Essential (primary) hypertension: Secondary | ICD-10-CM | POA: Insufficient documentation

## 2024-04-04 DIAGNOSIS — Z0389 Encounter for observation for other suspected diseases and conditions ruled out: Secondary | ICD-10-CM | POA: Diagnosis not present

## 2024-04-04 LAB — GROUP A STREP BY PCR: Group A Strep by PCR: NOT DETECTED

## 2024-04-04 LAB — RESP PANEL BY RT-PCR (FLU A&B, COVID) ARPGX2
Influenza A by PCR: NEGATIVE
Influenza B by PCR: NEGATIVE
SARS Coronavirus 2 by RT PCR: POSITIVE — AB

## 2024-04-04 MED ORDER — IBUPROFEN 600 MG PO TABS
600.0000 mg | ORAL_TABLET | Freq: Once | ORAL | Status: AC
Start: 2024-04-04 — End: 2024-04-04
  Administered 2024-04-04: 600 mg via ORAL

## 2024-04-04 MED ORDER — PREDNISONE 20 MG PO TABS: 40.0000 mg | ORAL_TABLET | Freq: Every day | ORAL | 0 refills | Status: AC

## 2024-04-04 MED ORDER — ALBUTEROL SULFATE HFA 108 (90 BASE) MCG/ACT IN AERS
5.0000 | INHALATION_SPRAY | Freq: Once | RESPIRATORY_TRACT | Status: AC
  Administered 2024-04-04: 5 via RESPIRATORY_TRACT

## 2024-04-04 MED ORDER — AEROCHAMBER PLUS FLO-VU MEDIUM MISC
1.0000 | Freq: Once | Status: AC
  Administered 2024-04-04: 1

## 2024-04-04 MED ORDER — NIRMATRELVIR/RITONAVIR (PAXLOVID)TABLET: 3.0000 | ORAL_TABLET | Freq: Two times a day (BID) | ORAL | 0 refills | Status: AC

## 2024-04-04 MED ORDER — ALBUTEROL SULFATE HFA 108 (90 BASE) MCG/ACT IN AERS: 1.0000 | INHALATION_SPRAY | RESPIRATORY_TRACT | 0 refills | Status: DC | PRN

## 2024-04-04 MED ORDER — PROMETHAZINE-DM 6.25-15 MG/5ML PO SYRP: 5.0000 mL | ORAL_SOLUTION | Freq: Four times a day (QID) | ORAL | 0 refills | Status: DC | PRN

## 2024-04-04 MED ORDER — IPRATROPIUM-ALBUTEROL 0.5-2.5 (3) MG/3ML IN SOLN: 3.0000 mL | Freq: Once | RESPIRATORY_TRACT | Status: DC

## 2024-04-04 NOTE — ED Provider Notes (Signed)
 HPI  SUBJECTIVE:  Kayla Christensen is a 58 y.o. female who presents with cough occasionally productive of clear sputum, sore throat, fevers Tmax 101, chills, body aches, headaches, nasal congestion, clear rhinorrhea, sinus pain and pressure, upper dental pain, postnasal drip, wheezing.  No shortness of breath, dyspnea on exertion.  States that her chest feels constantly tight starting this morning.  No radiation of this chest tightness, no exertional component.  She states that she had identical chest heaviness with her previous pneumonia.  No nausea, vomiting, diarrhea, abdominal pain.  No known COVID, flu, strep exposure.  She got 5-6 doses of the COVID-vaccine.  She did not get this years flu vaccine.  She is unable to sleep at night because of the cough.  She has been on antibiotics in the past 3 months for skin infection.  She has been taking Tylenol , last dose was within the past 6 hours, Oscillococcinum, Tylenol  Cold and sinus with improvement in her symptoms.  No aggravating factors.  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  States that she is compliant with her medications, and states that her blood pressure has been running in the 180/high 80s over the past few days.  No history of smoking, MI, coronary disease, diabetes, CVA, PAD/PVD.  PCP: [facility].  Past Medical History:  Diagnosis Date   History of kidney stones    Hypertension    Morbid obesity (HCC)     Past Surgical History:  Procedure Laterality Date   CHOLECYSTECTOMY     THYROIDECTOMY     TUBAL LIGATION      History reviewed. No pertinent family history.  Social History   Tobacco Use   Smoking status: Never   Smokeless tobacco: Never  Vaping Use   Vaping status: Never Used  Substance Use Topics   Alcohol use: No   Drug use: No    No  current facility-administered medications for this encounter.  Current Outpatient Medications:    nirmatrelvir /ritonavir  (PAXLOVID ) 20 x 150 MG & 10 x 100MG  TABS, Take 3 tablets by mouth 2 (two) times daily for 5 days. Patient GFR is 72. Take nirmatrelvir  (150 mg) two tablets twice daily for 5 days and ritonavir  (100 mg) one tablet twice daily for 5 days., Disp: 30 tablet, Rfl: 0   predniSONE  (DELTASONE ) 20 MG tablet, Take 2 tablets (40 mg total) by mouth daily with breakfast for 5 days., Disp: 10 tablet, Rfl: 0   promethazine -dextromethorphan (PROMETHAZINE -DM) 6.25-15 MG/5ML syrup, Take 5 mLs by mouth 4 (four) times daily as needed for cough., Disp: 118 mL, Rfl: 0   [Paused] atorvastatin  (LIPITOR) 40 MG tablet, Take 40 mg by mouth daily., Disp: , Rfl:    carvedilol  (COREG ) 25 MG tablet, Take 25 mg by mouth 2 (two) times daily with a meal., Disp: , Rfl:    hydrALAZINE  (APRESOLINE ) 25 MG tablet, Take 25 mg by mouth 2 (two) times daily., Disp: , Rfl:    [Paused] levothyroxine  (SYNTHROID , LEVOTHROID) 100 MCG tablet, Take 1 tablet (100 mcg total) by mouth every morning., Disp: 90 tablet, Rfl: 0   methocarbamol  (ROBAXIN ) 500 MG tablet, Take 1 tablet (500 mg total) by mouth 2 (two) times daily., Disp: 20 tablet, Rfl: 0   olmesartan (BENICAR) 40 MG tablet, Take 40 mg by mouth daily., Disp: , Rfl:    spironolactone (ALDACTONE) 25 MG tablet,  Take by mouth., Disp: , Rfl:   Allergies  Allergen Reactions   Lisinopril  Swelling   Lisinopril -Hydrochlorothiazide  Anaphylaxis   Penicillins Dermatitis, Hives and Swelling    Swelling to throat   Amlodipine  Other (See Comments)    Gingival hyperplasia  Gingival hyperplasia  Gingival hyperplasia  Gingival hyperplasia   Doxycycline  Diarrhea     ROS  As noted in HPI.   Physical Exam  BP (!) 178/92 (BP Location: Left Arm)   Pulse 82   Temp 98.4 F (36.9 C) (Oral)   Resp 16   Ht 5' 10 (1.778 m)   Wt 108.9 kg   SpO2 99%   BMI 34.44 kg/m  BP  Readings from Last 3 Encounters:  04/04/24 (!) 178/92  01/27/24 (!) 175/97  05/28/23 (!) 201/109    Constitutional: Well developed, well nourished, no acute distress Eyes: PERRL, EOMI, conjunctiva normal bilaterally HENT: Normocephalic, atraumatic,mucus membranes moist.  Swelling, erythematous turbinates.  Maxillary sinus tenderness.  Clear nasal congestion, positive postnasal drip.  Normal oropharynx. Neck: Positive cervical lymphadenopathy Respiratory: Wheezing left upper lobe, poor air movement.  Positive reproducible anterior, lateral chest wall tenderness Cardiovascular: Normal rate and rhythm, no murmurs, no gallops, no rubs GI: nondistended skin: No rash, skin intact Musculoskeletal: no deformities Neurologic: Alert & oriented x 3, CN III-XII grossly intact, no motor deficits, sensation grossly intact Psychiatric: Speech and behavior appropriate   ED Course   Medications  ibuprofen  (ADVIL ) tablet 600 mg (600 mg Oral Given 04/04/24 1925)  albuterol  (VENTOLIN  HFA) 108 (90 Base) MCG/ACT inhaler 5 puff (5 puffs Inhalation Given 04/04/24 1952)  AeroChamber Plus Flo-Vu Medium MISC 1 each (1 each Other Given 04/04/24 1955)    Orders Placed This Encounter  Procedures   Resp Panel by RT-PCR (Flu A&B, Covid) Anterior Nasal Swab    Standing Status:   Standing    Number of Occurrences:   1   Group A Strep by PCR    Standing Status:   Standing    Number of Occurrences:   1   DG Chest 2 View    Standing Status:   Standing    Number of Occurrences:   1    Reason for Exam (SYMPTOM  OR DIAGNOSIS REQUIRED):   COugh congestion wheeze LUL COVID + r/o PNA   Recheck vitals    45 to 60 minutes after giving medication.  Please notify for worsening fever, tachycardia or hypotension    Standing Status:   Standing    Number of Occurrences:   1   Results for orders placed or performed during the hospital encounter of 04/04/24 (from the past 24 hours)  Resp Panel by RT-PCR (Flu A&B, Covid)  Anterior Nasal Swab     Status: Abnormal   Collection Time: 04/04/24  6:52 PM   Specimen: Anterior Nasal Swab  Result Value Ref Range   SARS Coronavirus 2 by RT PCR POSITIVE (A) NEGATIVE   Influenza A by PCR NEGATIVE NEGATIVE   Influenza B by PCR NEGATIVE NEGATIVE  Group A Strep by PCR     Status: None   Collection Time: 04/04/24  6:52 PM   Specimen: Throat; Sterile Swab  Result Value Ref Range   Group A Strep by PCR NOT DETECTED NOT DETECTED   DG Chest 2 View Result Date: 04/04/2024 EXAM: 2 VIEW(S) XRAY OF THE CHEST 04/04/2024 07:58:15 PM COMPARISON: 11/22/2022 CLINICAL HISTORY: COugh congestion wheeze LUL COVID + r/o PNA. FINDINGS: LUNGS AND PLEURA: No focal pulmonary opacity. No pulmonary  edema. No pleural effusion. No pneumothorax. HEART AND MEDIASTINUM: No acute abnormality of the cardiac and mediastinal silhouettes. BONES AND SOFT TISSUES: No acute osseous abnormality. IMPRESSION: 1. No acute cardiopulmonary process. Electronically signed by: Pinkie Pebbles MD 04/04/2024 08:08 PM EDT RP Workstation: [id number]    ED Clinical Impression  1. COVID-19 virus infection   2. Reactive airways dysfunction syndrome (HCC)   3. Elevated blood pressure reading with diagnosis of hypertension      ED Assessment/Plan     Outside labs reviewed.  Additional medical history obtained.  Calculated creatinine clearance from labs done on 09/13/2023 112 mL/min.  GFR 72.  Patient presents with an acute illness with systemic symptoms of fever.  Giving 600 mg of ibuprofen  here.  Will recheck vitals.  Blood pressure, temperature trending down prior to discharge.  Strep, influenza negative.  COVID-positive.  Discussed with patient while in department.  States that she was given an antibiotic last time she had COVID.  Chart review shows that she was given doxycycline  for possible sinus infection in 2022 for COVID.  She was given 5 puffs from an albuterol  inhaler with a spacer here with improvement in  her chest tightness and wheezing.  Improved air movement on reexamination, lungs clear bilaterally.  Reviewed imaging independently.  No acute cardiopulmonary disease as read by me.  Will contact patient if radiology overread differs in the from mine and we need to change management.  Reviewed radiology report.  No acute cardiopulmonary disease consistent with my read. See radiology report for full details.  Will send home with Paxlovid , Promethazine  DM, prednisone  40 mg for 5 days, Mucinex, saline nasal irrigation, Tylenol /ibuprofen , work note.  2 puffs from albuterol  inhaler every 4-6 hours using spacer as needed for coughing, wheezing, shortness of breath.  Hypertensive emergency return precautions given.  Discussed labs, imaging, MDM, treatment plan, and plan for follow-up with patient Discussed sn/sx that should prompt return to the ED. patient agrees with plan.   Meds ordered this encounter  Medications   ibuprofen  (ADVIL ) tablet 600 mg   DISCONTD: ipratropium-albuterol  (DUONEB) 0.5-2.5 (3) MG/3ML nebulizer solution 3 mL   albuterol  (VENTOLIN  HFA) 108 (90 Base) MCG/ACT inhaler 5 puff   AeroChamber Plus Flo-Vu Medium MISC 1 each   DISCONTD: albuterol  (VENTOLIN  HFA) 108 (90 Base) MCG/ACT inhaler    Sig: Inhale 1-2 puffs into the lungs every 4 (four) hours as needed for wheezing or shortness of breath.    Dispense:  1 each    Refill:  0   nirmatrelvir /ritonavir  (PAXLOVID ) 20 x 150 MG & 10 x 100MG  TABS    Sig: Take 3 tablets by mouth 2 (two) times daily for 5 days. Patient GFR is 72. Take nirmatrelvir  (150 mg) two tablets twice daily for 5 days and ritonavir  (100 mg) one tablet twice daily for 5 days.    Dispense:  30 tablet    Refill:  0   promethazine -dextromethorphan (PROMETHAZINE -DM) 6.25-15 MG/5ML syrup    Sig: Take 5 mLs by mouth 4 (four) times daily as needed for cough.    Dispense:  118 mL    Refill:  0   predniSONE  (DELTASONE ) 20 MG tablet    Sig: Take 2 tablets (40 mg  total) by mouth daily with breakfast for 5 days.    Dispense:  10 tablet    Refill:  0      *This clinic note was created using Scientist, clinical (histocompatibility and immunogenetics). Therefore, there may be occasional mistakes despite careful proofreading. ?  Van Knee, MD 04/06/24 1144

## 2024-04-04 NOTE — ED Triage Notes (Signed)
 Pt c/o HA,chills,sore throat & congestion x1 day. Has tried OTC meds w/o relief.

## 2024-04-04 NOTE — Discharge Instructions (Signed)
 Your COVID is positive.  I have prescribed Paxlovid .  Go to [email or web address] [email or web address] and enter in your information, and you will get the Paxlovid  at a significantly reduced price, if not for free.  Go to the ER for difficulty breathing, if you get worse, or further concerns.   Saline nasal irrigation with NeilMed sinus rinse and distilled water as often as you want, Mucinex, Tylenol  1000 mg combined with 400 mg of Tylenol  3-4 times a day as needed for pain.  Promethazine  DM for cough.  Prednisone  will help with the inflammation in your lungs.  Do not take the Crestor or Synthroid  while taking the Paxlovid .  You must mask for 10 days after symptom onset.   2 puffs from your albuterol  inhaler every 4-6 hours as needed for coughing, wheezing, shortness of breath.  Decrease your salt intake. diet and exercise will lower your blood pressure significantly. It is important to keep your blood pressure under good control, as having a elevated blood pressure for prolonged periods of time significantly increases your risk of stroke, heart attacks, kidney damage, eye damage, and other problems. Get a validated blood pressure cuff that goes on your arm, not your wrist.  Measure your blood pressure once a day, preferably at the same time every day. Keep a log of this and bring it to your next doctor's appointment.  Bring your blood pressure cuff as well.  Return here in 2 weeks for blood pressure recheck if you're unable to find a primary care physician by then. Return immediately to the ER if you start having chest pain, headache, problems seeing, problems talking, problems walking, if you feel like you're about to pass out, if you do pass out, if you have a seizure, or for any other concerns.  Go to www.goodrx.com  or www.costplusdrugs.com to look up your medications. This will give you a list of where you can find your prescriptions at the most affordable prices. Or ask the pharmacist what the cash price is,  or if they have any other discount programs available to help make your medication more affordable. This can be less expensive than what you would pay with insurance.

## 2024-04-24 DIAGNOSIS — E039 Hypothyroidism, unspecified: Secondary | ICD-10-CM | POA: Diagnosis not present

## 2024-05-01 ENCOUNTER — Ambulatory Visit
Admission: EM | Admit: 2024-05-01 | Discharge: 2024-05-01 | Disposition: A | Discharge status: Home / Self Care | Attending: Emergency Medicine | Admitting: Emergency Medicine

## 2024-05-01 DIAGNOSIS — L089 Local infection of the skin and subcutaneous tissue, unspecified: Secondary | ICD-10-CM | POA: Diagnosis not present

## 2024-05-01 DIAGNOSIS — T148XXA Other injury of unspecified body region, initial encounter: Secondary | ICD-10-CM | POA: Diagnosis not present

## 2024-05-01 DIAGNOSIS — I10 Essential (primary) hypertension: Secondary | ICD-10-CM | POA: Diagnosis not present

## 2024-05-01 MED ORDER — EPINEPHRINE 0.3 MG/0.3ML IJ SOAJ: 0.3000 mg | INTRAMUSCULAR | 0 refills | Status: AC | PRN

## 2024-05-01 MED ORDER — SULFAMETHOXAZOLE-TRIMETHOPRIM 800-160 MG PO TABS: 1.0000 | ORAL_TABLET | Freq: Two times a day (BID) | ORAL | 0 refills | Status: DC

## 2024-05-01 MED ORDER — BACITRACIN ZINC 500 UNIT/GM EX OINT
TOPICAL_OINTMENT | Freq: Once | CUTANEOUS | Status: AC
  Administered 2024-05-01: 1 via TOPICAL

## 2024-05-01 MED ORDER — DOXYCYCLINE HYCLATE 100 MG PO CAPS: 100.0000 mg | ORAL_CAPSULE | Freq: Two times a day (BID) | ORAL | 0 refills | Status: AC

## 2024-05-01 MED ORDER — CEPHALEXIN 500 MG PO CAPS: 1000.0000 mg | ORAL_CAPSULE | Freq: Two times a day (BID) | ORAL | 0 refills | Status: DC

## 2024-05-01 MED ORDER — CHLORHEXIDINE GLUCONATE 4 % EX SOLN: Freq: Every day | CUTANEOUS | 0 refills | Status: AC | PRN

## 2024-05-01 NOTE — ED Triage Notes (Signed)
 Pt c/o wound on R leg x1 mon. States she bumped it yesterday at church & now drainage. Was seen by PCP 8/20 for the same issue & given bactrim  w/relief.

## 2024-05-01 NOTE — ED Provider Notes (Signed)
 HPI  SUBJECTIVE:  Kayla Christensen is a 58 y.o. female who presents with worsening of a chronic wound right lateral lower extremity initially sustained in the early summer.  She states that it scabbed over after being treated with 7 days of Bactrim  in August, but it never completely healed.  She bumped it yesterday, and the scab came off.  It has been oozing yellowish-brown pus and has an odor since.  No fevers, body aches, worsening pain.  She reports stinging pain, increasing surrounding redness and localized swelling.  She has tried bacitracin impregnated gauze, Tegaderm, hydrocolloid dressings, hydrogen peroxide and keeping it covered with a Band-Aid without improvement of symptoms.  No aggravating factors.  She has a past medical history of hypertension, hypothyroidism, borderline diabetes.  No history of MRSA, chronic kidney disease, peripheral neuropathy.  PCP: [facility].  She saw her PCP for similar issue on 8/20 and was treated with Bactrim  DS twice daily for 7 days.  Past Medical History:  Diagnosis Date   History of kidney stones    Hypertension    Morbid obesity (HCC)     Past Surgical History:  Procedure Laterality Date   CHOLECYSTECTOMY     THYROIDECTOMY     TUBAL LIGATION      History reviewed. No pertinent family history.  Social History   Tobacco Use   Smoking status: Never   Smokeless tobacco: Never  Vaping Use   Vaping status: Never Used  Substance Use Topics   Alcohol use: No   Drug use: No    No current facility-administered medications for this encounter.  Current Outpatient Medications:    sulfamethoxazole -trimethoprim  (BACTRIM  DS) 800-160 MG tablet, Take 1 tablet by mouth 2 (two) times daily for 7 days., Disp: 14 tablet, Rfl: 0   atorvastatin  (LIPITOR) 40 MG tablet, Take 40 mg by mouth daily., Disp: , Rfl:    carvedilol  (COREG ) 25 MG tablet, Take 25 mg by mouth 2 (two) times daily with a meal., Disp: , Rfl:    hydrALAZINE  (APRESOLINE ) 25  MG tablet, Take 25 mg by mouth 2 (two) times daily., Disp: , Rfl:    levothyroxine  (SYNTHROID , LEVOTHROID) 100 MCG tablet, Take 1 tablet (100 mcg total) by mouth every morning., Disp: 90 tablet, Rfl: 0   methocarbamol  (ROBAXIN ) 500 MG tablet, Take 1 tablet (500 mg total) by mouth 2 (two) times daily., Disp: 20 tablet, Rfl: 0   olmesartan (BENICAR) 40 MG tablet, Take 40 mg by mouth daily., Disp: , Rfl:    spironolactone (ALDACTONE) 25 MG tablet, Take by mouth., Disp: , Rfl:   Allergies  Allergen Reactions   Lisinopril  Swelling   Lisinopril -Hydrochlorothiazide  Anaphylaxis   Penicillins Dermatitis, Hives and Swelling    Swelling to throat   Amlodipine  Other (See Comments)    Gingival hyperplasia  Gingival hyperplasia  Gingival hyperplasia  Gingival hyperplasia   Doxycycline  Diarrhea     ROS  As noted in HPI.   Physical Exam  BP (!) 172/100 (BP Location: Left Arm)   Pulse 60   Temp 98.5 F (36.9 C) (Oral)   Resp 16   Ht 5' 10 (1.778 m)   Wt 104.3 kg   SpO2 99%   BMI 33.00 kg/m  BP Readings from Last 3 Encounters:  05/01/24 (!) 172/100  04/04/24 (!) 178/92  01/27/24 (!) 175/97    Constitutional: Well developed, well nourished, no acute distress Eyes:  EOMI, conjunctiva normal bilaterally HENT: Normocephalic, atraumatic,mucus membranes moist Respiratory: Normal inspiratory effort  Cardiovascular: Normal rate GI: nondistended skin: 3 x 3 cm tender chronic appearing ulcer with purulent drainage and surrounding erythema lateral right lower extremity  Musculoskeletal: no deformities Neurologic: Alert & oriented x 3, no focal neuro deficits Psychiatric: Speech and behavior appropriate   ED Course   Medications  bacitracin ointment (1 Application Topical Given 05/01/24 2009)    Orders Placed This Encounter  Procedures   Aerobic Culture w Gram Stain (superficial specimen)    Standing Status:   Standing    Number of Occurrences:   1   Ambulatory referral to Wound  Clinic    Referral Priority:   Urgent    Referral Type:   Consultation    Referral Reason:   Specialty Services Required    Requested Specialty:   Wound Care    Number of Visits Requested:   1   Apply dressing    Standing Status:   Standing    Number of Occurrences:   1    No results found for this or any previous visit (from the past 24 hours). No results found.  ED Clinical Impression  1. Chronic wound   2. Wound infection   3. Elevated blood pressure reading with diagnosis of hypertension      ED Assessment/Plan     Outside records, labs reviewed.  As noted in HPI.  Calculated creatinine clearance from labs done 6 months ago 107 mL/min.  1.  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  Patient has anaphylaxis to penicillins and diarrhea with doxycycline .  However, she would like to try Keflex and doxycycline .  Discussed chance of cross-reactivity to Keflex.  She still wants to try it.  Will send home with EpiPen in case she has an allergic reaction to the Keflex.   states that she is able to tolerate doxycycline . Discontinue hydrogen peroxide.  Keep clean with soap and water.  Wound culture sent.  Bacitracin and dressing redone.  Will place wound center referral.  2.  Elevated blood-pressure reading without diagnosis of hypertension.  Appears to be around baseline.  Will have her monitor this and follow-up with her PCP.  Written hypertensive emergency return precautions given.  Discussed MDM, treatment plan, and plan for follow-up with patient. Discussed sn/sx that should prompt return to the ED. patient agrees with plan.   Meds ordered this encounter  Medications   bacitracin ointment   sulfamethoxazole -trimethoprim  (BACTRIM  DS) 800-160 MG tablet    Sig: Take 1 tablet by mouth 2 (two) times daily for 7 days.    Dispense:  14 tablet    Refill:  0      *This clinic note was created using Scientist, clinical (histocompatibility and immunogenetics). Therefore, there may be occasional  mistakes despite careful proofreading.  ?    Van Knee, MD 05/02/24 [phone]

## 2024-05-01 NOTE — Discharge Instructions (Signed)
 Keep this clean with soap and water.  Discontinue hydrogen peroxide.  Apply bacitracin and a dressing once a day.  I have placed an urgent referral to the wound care center.  Decrease your salt intake. diet and exercise will lower your blood pressure significantly. It is important to keep your blood pressure under good control, as having a elevated blood pressure for prolonged periods of time significantly increases your risk of stroke, heart attacks, kidney damage, eye damage, and other problems. Get a validated blood pressure cuff that goes on your arm, not your wrist.  Measure your blood pressure once a day, preferably at the same time every day. Keep a log of this and bring it to your next doctor's appointment.  Bring your blood pressure cuff as well.  Return here in 2 weeks for blood pressure recheck if you're unable to find a primary care physician by then. Return immediately to the ER if you start having chest pain, headache, problems seeing, problems talking, problems walking, if you feel like you're about to pass out, if you do pass out, if you have a seizure, or for any other concerns.  Go to www.goodrx.com  or www.costplusdrugs.com to look up your medications. This will give you a list of where you can find your prescriptions at the most affordable prices. Or ask the pharmacist what the cash price is, or if they have any other discount programs available to help make your medication more affordable. This can be less expensive than what you would pay with insurance.

## 2024-05-05 ENCOUNTER — Ambulatory Visit (HOSPITAL_COMMUNITY): Payer: Self-pay

## 2024-05-05 LAB — AEROBIC CULTURE W GRAM STAIN (SUPERFICIAL SPECIMEN)

## 2024-05-05 MED ORDER — CEFDINIR 300 MG PO CAPS: 300.0000 mg | ORAL_CAPSULE | Freq: Two times a day (BID) | ORAL | 0 refills | Status: DC

## 2024-05-05 MED ORDER — CEFDINIR 300 MG PO CAPS
300.0000 mg | ORAL_CAPSULE | Freq: Two times a day (BID) | ORAL | 0 refills | Status: AC
Start: 2024-05-05 — End: 2024-05-12

## 2024-05-05 NOTE — Addendum Note (Signed)
 Addended by: ARNALDO ALFONSO RAMAN on: 05/05/2024 04:40 PM   Modules accepted: Orders

## 2024-05-05 NOTE — Telephone Encounter (Signed)
 Pharmacy changed per pt request

## 2024-05-05 NOTE — Telephone Encounter (Signed)
 Continue doxycycline .  Discontinue Keflex.  Begin Omnicef  1 tablet twice daily for 7 days.  Prescription sent to pharmacy listed in the EMR.

## 2024-05-18 ENCOUNTER — Encounter: Attending: Internal Medicine | Admitting: Internal Medicine

## 2024-05-18 DIAGNOSIS — L03115 Cellulitis of right lower limb: Secondary | ICD-10-CM | POA: Diagnosis not present

## 2024-05-18 DIAGNOSIS — I87301 Chronic venous hypertension (idiopathic) without complications of right lower extremity: Secondary | ICD-10-CM | POA: Diagnosis present

## 2024-05-18 DIAGNOSIS — B9689 Other specified bacterial agents as the cause of diseases classified elsewhere: Secondary | ICD-10-CM | POA: Diagnosis not present

## 2024-05-18 DIAGNOSIS — B9561 Methicillin susceptible Staphylococcus aureus infection as the cause of diseases classified elsewhere: Secondary | ICD-10-CM | POA: Diagnosis not present

## 2024-05-18 DIAGNOSIS — L97812 Non-pressure chronic ulcer of other part of right lower leg with fat layer exposed: Secondary | ICD-10-CM | POA: Diagnosis not present

## 2024-05-23 ENCOUNTER — Encounter: Admitting: Physician Assistant

## 2024-05-26 ENCOUNTER — Encounter: Admitting: Physician Assistant

## 2024-05-26 DIAGNOSIS — I87301 Chronic venous hypertension (idiopathic) without complications of right lower extremity: Secondary | ICD-10-CM | POA: Diagnosis not present

## 2024-05-26 DIAGNOSIS — L97812 Non-pressure chronic ulcer of other part of right lower leg with fat layer exposed: Secondary | ICD-10-CM | POA: Diagnosis not present

## 2024-05-29 ENCOUNTER — Encounter: Admitting: Physician Assistant

## 2024-06-02 ENCOUNTER — Encounter: Admitting: Physician Assistant

## 2024-06-02 DIAGNOSIS — I87301 Chronic venous hypertension (idiopathic) without complications of right lower extremity: Secondary | ICD-10-CM | POA: Diagnosis not present

## 2024-06-02 DIAGNOSIS — L97812 Non-pressure chronic ulcer of other part of right lower leg with fat layer exposed: Secondary | ICD-10-CM | POA: Diagnosis not present

## 2024-06-05 DIAGNOSIS — Z6833 Body mass index (BMI) 33.0-33.9, adult: Secondary | ICD-10-CM | POA: Diagnosis not present

## 2024-06-05 DIAGNOSIS — I10 Essential (primary) hypertension: Secondary | ICD-10-CM | POA: Diagnosis not present

## 2024-06-05 DIAGNOSIS — Z23 Encounter for immunization: Secondary | ICD-10-CM | POA: Diagnosis not present

## 2024-06-05 DIAGNOSIS — L089 Local infection of the skin and subcutaneous tissue, unspecified: Secondary | ICD-10-CM | POA: Diagnosis not present

## 2024-06-05 DIAGNOSIS — E039 Hypothyroidism, unspecified: Secondary | ICD-10-CM | POA: Diagnosis not present

## 2024-06-05 DIAGNOSIS — T148XXA Other injury of unspecified body region, initial encounter: Secondary | ICD-10-CM | POA: Diagnosis not present

## 2024-06-05 DIAGNOSIS — E6609 Other obesity due to excess calories: Secondary | ICD-10-CM | POA: Diagnosis not present

## 2024-06-05 DIAGNOSIS — M1712 Unilateral primary osteoarthritis, left knee: Secondary | ICD-10-CM | POA: Diagnosis not present

## 2024-06-05 DIAGNOSIS — R7303 Prediabetes: Secondary | ICD-10-CM | POA: Diagnosis not present

## 2024-06-05 DIAGNOSIS — N950 Postmenopausal bleeding: Secondary | ICD-10-CM | POA: Diagnosis not present

## 2024-06-05 DIAGNOSIS — E782 Mixed hyperlipidemia: Secondary | ICD-10-CM | POA: Diagnosis not present

## 2024-06-07 ENCOUNTER — Encounter: Admitting: Physician Assistant

## 2024-06-07 DIAGNOSIS — L97812 Non-pressure chronic ulcer of other part of right lower leg with fat layer exposed: Secondary | ICD-10-CM | POA: Diagnosis not present

## 2024-06-07 DIAGNOSIS — I87301 Chronic venous hypertension (idiopathic) without complications of right lower extremity: Secondary | ICD-10-CM | POA: Diagnosis not present

## 2024-06-15 ENCOUNTER — Encounter: Attending: Physician Assistant

## 2024-06-15 DIAGNOSIS — E039 Hypothyroidism, unspecified: Secondary | ICD-10-CM | POA: Insufficient documentation

## 2024-06-15 DIAGNOSIS — L03115 Cellulitis of right lower limb: Secondary | ICD-10-CM | POA: Insufficient documentation

## 2024-06-15 DIAGNOSIS — R7303 Prediabetes: Secondary | ICD-10-CM | POA: Insufficient documentation

## 2024-06-15 DIAGNOSIS — I87301 Chronic venous hypertension (idiopathic) without complications of right lower extremity: Secondary | ICD-10-CM | POA: Insufficient documentation

## 2024-06-15 DIAGNOSIS — L97818 Non-pressure chronic ulcer of other part of right lower leg with other specified severity: Secondary | ICD-10-CM | POA: Insufficient documentation

## 2024-06-15 DIAGNOSIS — E785 Hyperlipidemia, unspecified: Secondary | ICD-10-CM | POA: Diagnosis not present

## 2024-06-15 DIAGNOSIS — I10 Essential (primary) hypertension: Secondary | ICD-10-CM | POA: Diagnosis not present

## 2024-06-22 ENCOUNTER — Encounter: Admitting: Internal Medicine

## 2024-06-22 DIAGNOSIS — L97818 Non-pressure chronic ulcer of other part of right lower leg with other specified severity: Secondary | ICD-10-CM | POA: Diagnosis not present

## 2024-06-22 DIAGNOSIS — I87301 Chronic venous hypertension (idiopathic) without complications of right lower extremity: Secondary | ICD-10-CM | POA: Diagnosis not present

## 2024-06-27 DIAGNOSIS — N9489 Other specified conditions associated with female genital organs and menstrual cycle: Secondary | ICD-10-CM | POA: Diagnosis not present

## 2024-06-27 DIAGNOSIS — N950 Postmenopausal bleeding: Secondary | ICD-10-CM | POA: Diagnosis not present

## 2024-06-29 ENCOUNTER — Encounter: Admitting: Internal Medicine

## 2024-06-29 DIAGNOSIS — I87301 Chronic venous hypertension (idiopathic) without complications of right lower extremity: Secondary | ICD-10-CM | POA: Diagnosis not present

## 2024-06-29 DIAGNOSIS — L97818 Non-pressure chronic ulcer of other part of right lower leg with other specified severity: Secondary | ICD-10-CM | POA: Diagnosis not present

## 2024-07-12 ENCOUNTER — Encounter: Admitting: Physician Assistant

## 2024-07-12 DIAGNOSIS — I87301 Chronic venous hypertension (idiopathic) without complications of right lower extremity: Secondary | ICD-10-CM | POA: Diagnosis not present

## 2024-07-20 ENCOUNTER — Encounter: Payer: Self-pay | Attending: Physician Assistant | Admitting: Physician Assistant

## 2024-07-20 DIAGNOSIS — L03115 Cellulitis of right lower limb: Secondary | ICD-10-CM | POA: Diagnosis not present

## 2024-07-20 DIAGNOSIS — I87301 Chronic venous hypertension (idiopathic) without complications of right lower extremity: Secondary | ICD-10-CM | POA: Insufficient documentation

## 2024-07-20 DIAGNOSIS — L97818 Non-pressure chronic ulcer of other part of right lower leg with other specified severity: Secondary | ICD-10-CM | POA: Insufficient documentation

## 2024-07-27 ENCOUNTER — Encounter: Admitting: Physician Assistant

## 2024-07-27 DIAGNOSIS — I87301 Chronic venous hypertension (idiopathic) without complications of right lower extremity: Secondary | ICD-10-CM | POA: Diagnosis not present

## 2024-08-03 ENCOUNTER — Encounter: Admitting: Physician Assistant

## 2024-08-10 ENCOUNTER — Encounter: Admitting: Physician Assistant

## 2024-08-10 DIAGNOSIS — I87301 Chronic venous hypertension (idiopathic) without complications of right lower extremity: Secondary | ICD-10-CM | POA: Diagnosis not present

## 2024-08-18 ENCOUNTER — Encounter: Payer: Self-pay | Admitting: Physician Assistant

## 2024-08-24 ENCOUNTER — Encounter: Payer: Self-pay | Admitting: Physician Assistant
